# Patient Record
Sex: Female | Born: 1985 | Race: Black or African American | Hispanic: No | Marital: Single | Smoking: Current every day smoker
Health system: Southern US, Community
[De-identification: ages and names within clinical notes are randomized; demographics above are authoritative.]

## PROBLEM LIST (undated history)

## (undated) DIAGNOSIS — D649 Anemia, unspecified: Secondary | ICD-10-CM

## (undated) HISTORY — DX: Morbid (severe) obesity due to excess calories: E66.01

---

## 2017-01-13 ENCOUNTER — Other Ambulatory Visit: Payer: Self-pay

## 2017-01-13 ENCOUNTER — Encounter (HOSPITAL_COMMUNITY): Payer: Self-pay | Admitting: *Deleted

## 2017-01-13 ENCOUNTER — Emergency Department (HOSPITAL_COMMUNITY): Payer: Medicaid Other

## 2017-01-13 ENCOUNTER — Emergency Department (HOSPITAL_COMMUNITY)
Admission: EM | Admit: 2017-01-13 | Discharge: 2017-01-13 | Disposition: A | Discharge status: Home / Self Care | Payer: Medicaid Other | Attending: Emergency Medicine | Admitting: Emergency Medicine

## 2017-01-13 DIAGNOSIS — F17210 Nicotine dependence, cigarettes, uncomplicated: Secondary | ICD-10-CM | POA: Insufficient documentation

## 2017-01-13 DIAGNOSIS — M7918 Myalgia, other site: Secondary | ICD-10-CM | POA: Insufficient documentation

## 2017-01-13 DIAGNOSIS — R0789 Other chest pain: Secondary | ICD-10-CM | POA: Diagnosis not present

## 2017-01-13 DIAGNOSIS — R079 Chest pain, unspecified: Secondary | ICD-10-CM | POA: Diagnosis present

## 2017-01-13 LAB — CBC WITH DIFFERENTIAL/PLATELET
BASOS ABS: 0 10*3/uL (ref 0.0–0.1)
Basophils Relative: 0 %
EOS PCT: 2 %
Eosinophils Absolute: 0.1 10*3/uL (ref 0.0–0.7)
HCT: 36.1 % (ref 36.0–46.0)
Hemoglobin: 12.2 g/dL (ref 12.0–15.0)
LYMPHS PCT: 36 %
Lymphs Abs: 1.4 10*3/uL (ref 0.7–4.0)
MCH: 30.2 pg (ref 26.0–34.0)
MCHC: 33.8 g/dL (ref 30.0–36.0)
MCV: 89.4 fL (ref 78.0–100.0)
Monocytes Absolute: 0.3 10*3/uL (ref 0.1–1.0)
Monocytes Relative: 9 %
NEUTROS ABS: 2.1 10*3/uL (ref 1.7–7.7)
Neutrophils Relative %: 53 %
PLATELETS: 225 10*3/uL (ref 150–400)
RBC: 4.04 MIL/uL (ref 3.87–5.11)
RDW: 13.6 % (ref 11.5–15.5)
WBC: 4 10*3/uL (ref 4.0–10.5)

## 2017-01-13 LAB — COMPREHENSIVE METABOLIC PANEL
ALBUMIN: 3.8 g/dL (ref 3.5–5.0)
ALT: 13 U/L — AB (ref 14–54)
AST: 24 U/L (ref 15–41)
Alkaline Phosphatase: 54 U/L (ref 38–126)
Anion gap: 8 (ref 5–15)
BUN: 13 mg/dL (ref 6–20)
CHLORIDE: 109 mmol/L (ref 101–111)
CO2: 23 mmol/L (ref 22–32)
CREATININE: 0.93 mg/dL (ref 0.44–1.00)
Calcium: 9 mg/dL (ref 8.9–10.3)
GFR calc Af Amer: >60 mL/min (ref >60)
GLUCOSE: 104 mg/dL — AB (ref 65–99)
Potassium: 3.6 mmol/L (ref 3.5–5.1)
Sodium: 140 mmol/L (ref 135–145)
Total Bilirubin: 0.5 mg/dL (ref 0.3–1.2)
Total Protein: 6.7 g/dL (ref 6.5–8.1)

## 2017-01-13 LAB — I-STAT BETA HCG BLOOD, ED (MC, WL, AP ONLY): I-stat hCG, quantitative: <5 m[IU]/mL (ref <5)

## 2017-01-13 LAB — TROPONIN I: Troponin I: <0.03 ng/mL (ref <0.03)

## 2017-01-13 NOTE — ED Provider Notes (Signed)
[facility]   CSN: [id number] Arrival date & time: 01/13/17  0444     History   Chief Complaint Chief Complaint  Patient presents with  . Chest Pain    HPI Amanda Heath is a 31 y.o. female.  Patient with history of smoking presents with mid chest pain starting yesterday at noon.  Pain did not radiate.  It is described as sharp and worse with deep breathing.  It also hurts when she pulls herself up or moves her upper body.  She did not have any associated diaphoresis, nausea or vomiting.  Patient is very active and has not had symptoms prior to this.  It does not seem to be related to food.  Patient denies heavy NSAID or alcohol use.  Patient had a "charley horse" in her left upper arm this morning.  No significant family history of coronary artery disease in first-degree relatives.  Patient denies risk factors for pulmonary embolism including: unilateral leg swelling, history of DVT/PE/other blood clots, use of exogenous hormones, recent immobilizations, recent surgery, recent travel (>4hr segment), malignancy, hemoptysis. No treatments prior to arrival.  No fevers or cough.  No abdominal pain or diarrhea.      History reviewed. No pertinent past medical history.  There are no active problems to display for this patient.   Past Surgical History:  Procedure Laterality Date  . CESAREAN SECTION      OB History    No data available       Home Medications    Prior to Admission medications   Not on File    Family History No family history on file.  Social History Social History   Tobacco Use  . Smoking status: Current Every Day Smoker    Packs/day: 0.50    Types: Cigarettes  . Smokeless tobacco: Never Used  Substance Use Topics  . Alcohol use: Yes    Comment: ocassionally  . Drug use: Yes    Types: Marijuana     Allergies   Percocet [oxycodone-acetaminophen]   Review of Systems Review of Systems    Constitutional: Negative for diaphoresis and fever.  Eyes: Negative for redness.  Respiratory: Negative for cough and shortness of breath.   Cardiovascular: Positive for chest pain. Negative for palpitations and leg swelling.  Gastrointestinal: Negative for abdominal pain, nausea and vomiting.  Genitourinary: Negative for dysuria.  Musculoskeletal: Positive for myalgias. Negative for back pain and neck pain.  Skin: Negative for rash.  Neurological: Negative for syncope and light-headedness.  Psychiatric/Behavioral: The patient is not nervous/anxious.      Physical Exam Updated Vital Signs BP (!) 142/81 (BP Location: Right Arm)   Pulse 60   Temp 97.9 F (36.6 C) (Oral)   Resp 12   Ht 5\' 3"  (1.6 m)   Wt 95.3 kg (210 lb)   LMP 01/05/2017   SpO2 100%   BMI 37.20 kg/m   Physical Exam  Constitutional: She appears well-developed and well-nourished.  HENT:  Head: Normocephalic and atraumatic.  Eyes: Conjunctivae are normal. Right eye exhibits no discharge. Left eye exhibits no discharge.  Neck: Normal range of motion. Neck supple.  Cardiovascular: Normal rate, regular rhythm and normal heart sounds.  No murmur heard. Patient's CP causes her to wince when she pulls herself up to allow for listening to lung sounds on back. Also reproducible pain on palpation above the left breast.   Pulmonary/Chest: Effort normal and breath sounds normal. She has no decreased breath  sounds.  Abdominal: Soft. There is no tenderness.  Neurological: She is alert.  Skin: Skin is warm and dry.  Psychiatric: She has a normal mood and affect.  Nursing note and vitals reviewed.    ED Treatments / Results  Labs (all labs ordered are listed, but only abnormal results are displayed) Labs Reviewed  COMPREHENSIVE METABOLIC PANEL - Abnormal; Notable for the following components:      Result Value   Glucose, Bld 104 (*)    ALT 13 (*)    All other components within normal limits  CBC WITH  DIFFERENTIAL/PLATELET  TROPONIN I  I-STAT BETA HCG BLOOD, ED ([facility], [facility], [facility] ONLY)   ED ECG REPORT   Date: 01/13/2017  Rate: 62  Rhythm: normal sinus rhythm  QRS Axis: normal  Intervals: normal  ST/T Wave abnormalities: normal  Conduction Disutrbances:none  Narrative Interpretation:   Old EKG Reviewed: none available  I have personally reviewed the EKG tracing and agree with the computerized printout as noted.  Radiology Dg Chest 2 View  Result Date: 01/13/2017 CLINICAL DATA:  Chest pain EXAM: CHEST  2 VIEW COMPARISON:  None. FINDINGS: The cardiomediastinal contours are normal. The lungs are clear. Pulmonary vasculature is normal. No consolidation, pleural effusion, or pneumothorax. No acute osseous abnormalities are seen. IMPRESSION: No acute pulmonary process. Electronically Signed   By: Rubye OaksMelanie  Ehinger M.D.   On: 01/13/2017 05:38    Procedures Procedures (including critical care time)  Medications Ordered in ED Medications - No data to display   Initial Impression / Assessment and Plan / ED Course  I have reviewed the triage vital signs and the nursing notes.  Pertinent labs & imaging results that were available during my care of the patient were reviewed by me and considered in my medical decision making (see chart for details).     Patient seen and examined.   Vital signs reviewed and are as follows: BP (!) 142/81 (BP Location: Right Arm)   Pulse 60   Temp 97.9 F (36.6 C) (Oral)   Resp 12   Ht 5\' 3"  (1.6 m)   Wt 95.3 kg (210 lb)   LMP 01/05/2017   SpO2 100%   BMI 37.20 kg/m   Reviewed her results at bedside.   Patient was counseled to return with severe chest pain, especially if the pain is crushing or pressure-like and spreads to the arms, back, neck, or jaw, or if they have sweating, nausea, or shortness of breath with the pain. They were encouraged to call 911 with these symptoms.   They were also told to return if their chest pain gets worse and does  not go away with rest, they have an attack of chest pain lasting longer than usual despite rest and treatment with the medications their caregiver has prescribed, if they wake from sleep with chest pain or shortness of breath, if they feel dizzy or faint, if they have chest pain not typical of their usual pain, or if they have any other emergent concerns regarding their health.  The patient verbalized understanding and agreed.    Final Clinical Impressions(s) / ED Diagnoses   Final diagnoses:  Musculoskeletal chest pain   Patient with musculoskeletal-like chest pain.  Symptoms started greater than 12 hours ago and have been persistent.  Worse with positioning and movement.  EKG without any ischemic changes.  Troponin negative x1.  Chest x-ray clear.  Patient is PERC negative. HEART score = 1.  Feel patient is safe for discharged  home with follow-up with her PCP or return with worsening as discussed above.   ED Discharge Orders    None       Renne Crigler, PA-C 01/13/17 0720    Ward, Layla Maw, DO 01/13/17 2349

## 2017-01-13 NOTE — Discharge Instructions (Signed)
Please read and follow all provided instructions.  Your diagnoses today include:  1. Musculoskeletal chest pain    Tests performed today include:  An EKG of your heart  A chest x-ray  Cardiac enzymes - a blood test for heart muscle damage  Blood counts and electrolytes  Vital signs. See below for your results today.   Medications prescribed:   Naproxen - anti-inflammatory pain medication  Do not exceed 500mg  naproxen every 12 hours, take with food  You have been prescribed an anti-inflammatory medication or NSAID. Take with food. Take smallest effective dose for the shortest duration needed for your pain. Stop taking if you experience stomach pain or vomiting.    Pepcid (famotidine) - antihistamine  You can find this medication over-the-counter.   DO NOT exceed:   20mg  Pepcid every 12 hours  Take any prescribed medications only as directed.  Follow-up instructions: Please follow-up with your primary care provider as needed for further evaluation of your symptoms.   Return instructions:  SEEK IMMEDIATE MEDICAL ATTENTION IF:  You have severe chest pain, especially if the pain is crushing or pressure-like and spreads to the arms, back, neck, or jaw, or if you have sweating, nausea (feeling sick to your stomach), or shortness of breath. THIS IS AN EMERGENCY. Don't wait to see if the pain will go away. Get medical help at once. Call 911 or 0 (operator). DO NOT drive yourself to the hospital.   Your chest pain gets worse and does not go away with rest.   You have an attack of chest pain lasting longer than usual, despite rest and treatment with the medications your caregiver has prescribed.   You wake from sleep with chest pain or shortness of breath.  You feel dizzy or faint.  You have chest pain not typical of your usual pain for which you originally saw your caregiver.   You have any other emergent concerns regarding your health.  Additional Information: Chest  pain comes from many different causes. Your caregiver has diagnosed you as having chest pain that is not specific for one problem, but does not require admission.  You are at low risk for an acute heart condition or other serious illness.   Your vital signs today were: BP (!) 142/81 (BP Location: Right Arm)    Pulse 60    Temp 97.9 F (36.6 C) (Oral)    Resp 12    Ht 5\' 3"  (1.6 m)    Wt 95.3 kg (210 lb)    LMP 01/05/2017    SpO2 100%    BMI 37.20 kg/m  If your blood pressure (BP) was elevated above 135/85 this visit, please have this repeated by your doctor within one month. --------------

## 2017-01-13 NOTE — ED Triage Notes (Signed)
Patient stated her CP started about noon yesterday.  Stated she did not feel well but thought it might go away.  This morning getting ready for work noticed when she would take a deep breath her chest hurts.

## 2017-08-24 ENCOUNTER — Ambulatory Visit: Payer: Self-pay | Admitting: General Surgery

## 2017-08-24 NOTE — H&P (Signed)
History of Present Illness Axel Filler MD; 08/24/2017 2:56 PM) The patient is a 32 year old female who presents with a complaint of posterior right thigh skin tag. Patient is a 32 year old female who is referred by Dr. Isaac Bliss for chief complaint of right posterior thigh skin tag.  Patient states visit been there for several years. She states it is getting bigger and is bothersome, is not sharp, as it does get hung up on her clothing at times. She states that the no signs of infection or drainage from the area.  Patient only had a previous C-section in the past.   Past Surgical History Maurilio Lovely; 08/24/2017 2:27 PM) Cesarean Section - 1  Oral Surgery   Diagnostic Studies History Maurilio Lovely; 08/24/2017 2:27 PM) Colonoscopy  never Mammogram  >3 years ago Pap Smear  1-5 years ago  Allergies Maurilio Lovely; 08/24/2017 2:29 PM) Percocet *ANALGESICS - OPIOID*   Medication History Maurilio Lovely; 08/24/2017 2:29 PM) No Current Medications Medications Reconciled  Social History Maurilio Lovely; 08/24/2017 2:27 PM) Caffeine use  Carbonated beverages, Coffee. Illicit drug use  Uses socially only. Tobacco use  Current every day smoker.  Family History Maurilio Lovely; 08/24/2017 2:27 PM) First Degree Relatives  No pertinent family history   Pregnancy / Birth History Maurilio Lovely; 08/24/2017 2:27 PM) Age at menarche  16 years. Gravida  1 Maternal age  87-20 Para  1  Other Problems Maurilio Lovely; 08/24/2017 2:27 PM) No pertinent past medical history     Review of Systems Axel Filler MD; 08/24/2017 2:55 PM) General Not Present- Appetite Loss, Chills, Fatigue, Fever, Night Sweats, Weight Gain and Weight Loss. Skin Not Present- Change in Wart/Mole, Dryness, Hives, Jaundice, New Lesions, Non-Healing Wounds, Rash and Ulcer. HEENT Not Present- Earache, Hearing Loss, Hoarseness, Nose Bleed, Oral Ulcers, Ringing in the Ears, Seasonal Allergies, Sinus Pain, Sore  Throat, Visual Disturbances, Wears glasses/contact lenses and Yellow Eyes. Respiratory Not Present- Bloody sputum, Chronic Cough, Difficulty Breathing, Snoring and Wheezing. Breast Not Present- Breast Mass, Breast Pain, Nipple Discharge and Skin Changes. Cardiovascular Not Present- Chest Pain, Difficulty Breathing Lying Down, Leg Cramps, Palpitations, Rapid Heart Rate, Shortness of Breath and Swelling of Extremities. Gastrointestinal Not Present- Abdominal Pain, Bloating, Bloody Stool, Change in Bowel Habits, Chronic diarrhea, Constipation, Difficulty Swallowing, Excessive gas, Gets full quickly at meals, Hemorrhoids, Indigestion, Nausea, Rectal Pain and Vomiting. Female Genitourinary Not Present- Frequency, Nocturia, Painful Urination, Pelvic Pain and Urgency. Musculoskeletal Not Present- Back Pain, Joint Pain, Joint Stiffness, Muscle Pain, Muscle Weakness and Swelling of Extremities. Neurological Not Present- Decreased Memory, Fainting, Headaches, Numbness, Seizures, Tingling, Tremor, Trouble walking and Weakness. Psychiatric Not Present- Anxiety, Bipolar, Change in Sleep Pattern, Depression, Fearful and Frequent crying. Hematology Not Present- Blood Thinners, Easy Bruising, Excessive bleeding, Gland problems, HIV and Persistent Infections. All other systems negative  Vitals Maurilio Lovely; 08/24/2017 2:30 PM) 08/24/2017 2:29 PM Weight: 206.25 lb Height: 62in Body Surface Area: 1.94 m Body Mass Index: 37.72 kg/m  Temp.: 98.56F(Oral)  Pulse: 79 (Regular)  BP: 126/72 (Sitting, Left Arm, Standard)       Physical Exam Axel Filler MD; 08/24/2017 2:56 PM) The physical exam findings are as follows: Note:Constitutional: No acute distress, conversant, appears stated age  Eyes: Anicteric sclerae, moist conjunctiva, no lid lag  Neck: No thyromegaly, trachea midline, no cervical lymphadenopathy  Lungs: Clear to auscultation biilaterally, normal respiratory  effot  Cardiovascular: regular rate & rhythm, no murmurs, no peripheal edema, pedal pulses 2+  GI: Soft, no masses or hepatosplenomegaly, non-tender  to palpation  MSK: Normal gait, no clubbing cyanosis, edema  Skin: No rashes, palpation reveals normal skin turgor, right posterior thigh skin tag, approximately 3 x 3 cm in size  Psychiatric: Appropriate judgment and insight, oriented to person, place, and time    Assessment & Plan Axel Filler(Ebony Rickel MD; 08/24/2017 2:57 PM) SKIN TAG (L91.8) Impression: 32 year old female with a posterior right thigh skin tag  1. The patient will like to proceed to the operating room for excision of skin tag 2. Discussed with her the risks and benefits of the procedure to include but not limited to: Infection, bleeding, damage to structures, possible wound dehiscence. The patient was understanding and wishes to proceed.

## 2017-09-03 ENCOUNTER — Encounter (HOSPITAL_COMMUNITY): Payer: Self-pay | Admitting: Emergency Medicine

## 2017-09-03 ENCOUNTER — Ambulatory Visit (HOSPITAL_COMMUNITY)
Admission: EM | Admit: 2017-09-03 | Discharge: 2017-09-03 | Disposition: A | Discharge status: Home / Self Care | Payer: Medicaid Other | Attending: Internal Medicine | Admitting: Internal Medicine

## 2017-09-03 ENCOUNTER — Ambulatory Visit (INDEPENDENT_AMBULATORY_CARE_PROVIDER_SITE_OTHER): Payer: Medicaid Other

## 2017-09-03 DIAGNOSIS — M79672 Pain in left foot: Secondary | ICD-10-CM

## 2017-09-03 MED ORDER — MELOXICAM 7.5 MG PO TABS: 7.5000 mg | ORAL_TABLET | Freq: Every day | ORAL | 0 refills | Status: DC

## 2017-09-03 NOTE — ED Provider Notes (Signed)
[facility]    CSN: [id number] Arrival date & time: 09/03/17  0957     History   Chief Complaint Chief Complaint  Patient presents with  . Ankle Pain    HPI Amanda Heath is a 32 y.o. female.   32 year old female with no significant past medical history comes in for 2-week history of left ankle/foot pain.  States 2 weeks ago, she stepped into a ditch, and inverted her ankle.  There is significant swelling at the time, she has been elevating with ice compress with good relief.  However, she continues to have painful weightbearing, and has pain at rest as well.  States certain movement of the ankle or foot would exacerbate the pain.  She denies any numbness, tingling.  States continued to apply ice. Has not taken anything for the symptoms.       History reviewed. No pertinent past medical history.  There are no active problems to display for this patient.   Past Surgical History:  Procedure Laterality Date  . CESAREAN SECTION      OB History   None      Home Medications    Prior to Admission medications   Medication Sig Start Date End Date Taking? Authorizing Provider  meloxicam (MOBIC) 7.5 MG tablet Take 1 tablet (7.5 mg total) by mouth daily. 09/03/17   Belinda Fisher, PA-C    Family History History reviewed. No pertinent family history.  Social History Social History   Tobacco Use  . Smoking status: Current Every Day Smoker    Packs/day: 0.50    Types: Cigarettes  . Smokeless tobacco: Never Used  Substance Use Topics  . Alcohol use: Yes    Comment: ocassionally  . Drug use: Yes    Types: Marijuana     Allergies   Percocet [oxycodone-acetaminophen]   Review of Systems Review of Systems  Reason unable to perform ROS: See HPI as above.     Physical Exam Triage Vital Signs ED Triage Vitals [09/03/17 1011]  Enc Vitals Group     BP 137/84     Pulse Rate 83     Resp 18     Temp 98.4 F (36.9 C)     Temp Source Oral     SpO2 99 %   Weight      Height      Head Circumference      Peak Flow      Pain Score      Pain Loc      Pain Edu?      Excl. in GC?    No data found.  Updated Vital Signs BP 137/84 (BP Location: Right Arm)   Pulse 83   Temp 98.4 F (36.9 C) (Oral)   Resp 18   LMP 08/16/2017 (Exact Date)   SpO2 99%   Physical Exam  Constitutional: She is oriented to person, place, and time. She appears well-developed and well-nourished. No distress.  HENT:  Head: Normocephalic and atraumatic.  Eyes: Pupils are equal, round, and reactive to light. Conjunctivae are normal.  Musculoskeletal:  No obvious swelling, erythema, increased warmth, contusion seen.  No tenderness to palpation of  bilateral malleolus.  Diffuse tenderness to palpation of proximal MTPs.  Mild tenderness to palpation of third distal MTP.  Full range of motion of ankle.  Strength normal and equal bilaterally.  Sensation intact and equal bilaterally.  Pedal pulse 2+ and equal bilaterally.  Cap refill, tested on toe pad, less than 2  seconds.  Neurological: She is alert and oriented to person, place, and time.  Skin: She is not diaphoretic.   UC Treatments / Results  Labs (all labs ordered are listed, but only abnormal results are displayed) Labs Reviewed - No data to display  EKG None  Radiology Dg Foot Complete Left  Result Date: 09/03/2017 CLINICAL DATA:  Pain EXAM: LEFT FOOT - COMPLETE 3+ VIEW COMPARISON:  None. FINDINGS: There is no evidence of fracture or dislocation. There is no evidence of arthropathy or other focal bone abnormality. Soft tissues are unremarkable. IMPRESSION: Negative. Electronically Signed   By: Charlett NoseKevin  Dover M.D.   On: 09/03/2017 10:54    Procedures Procedures (including critical care time)  Medications Ordered in UC Medications - No data to display  Initial Impression / Assessment and Plan / UC Course  I have reviewed the triage vital signs and the nursing notes.  Pertinent labs & imaging results that  were available during my care of the patient were reviewed by me and considered in my medical decision making (see chart for details).    Discussed with patient given history and exam, low suspicion for fracture at  this time.  Risks and benefits of x-ray discussed, patient would like to proceed as no improvement of pain after 2 weeks.  Will obtain x-ray for further evaluation.  X-ray negative for fracture or dislocation.  NSAIDs, ice compress, elevation, Ace wrap during activity.  Return precautions given.  Patient expresses understanding and agrees to plan.  Final Clinical Impressions(s) / UC Diagnoses   Final diagnoses:  Left foot pain    ED Prescriptions    Medication Sig Dispense Auth. Provider   meloxicam (MOBIC) 7.5 MG tablet Take 1 tablet (7.5 mg total) by mouth daily. 15 tablet Threasa AlphaYu, Jodell Weitman V, PA-C        Journee Bobrowski V, [place]PA-C 09/03/17 1747

## 2017-09-03 NOTE — ED Triage Notes (Signed)
Pt here for left ankle pain after twisting 2 weeks ago

## 2017-09-03 NOTE — Discharge Instructions (Signed)
X-ray negative for fracture or dislocation.  Start Mobic for pain and inflammation. Do not take ibuprofen (motrin/advil)/ naproxen (aleve) while on mobic.  Continue ice compress, elevation.  Ace wrap during activity.  This may take a few weeks to completely resolve, but should be feeling better each week.  Follow-up with PCP for further evaluation if symptoms not improving.

## 2017-09-20 ENCOUNTER — Encounter (HOSPITAL_COMMUNITY): Payer: Self-pay | Admitting: Emergency Medicine

## 2017-09-20 ENCOUNTER — Emergency Department (HOSPITAL_COMMUNITY): Payer: Medicaid Other

## 2017-09-20 ENCOUNTER — Other Ambulatory Visit: Payer: Self-pay

## 2017-09-20 ENCOUNTER — Emergency Department (HOSPITAL_COMMUNITY)
Admission: EM | Admit: 2017-09-20 | Discharge: 2017-09-20 | Disposition: A | Discharge status: Home / Self Care | Payer: Medicaid Other | Attending: Emergency Medicine | Admitting: Emergency Medicine

## 2017-09-20 DIAGNOSIS — Y9389 Activity, other specified: Secondary | ICD-10-CM | POA: Diagnosis not present

## 2017-09-20 DIAGNOSIS — F17210 Nicotine dependence, cigarettes, uncomplicated: Secondary | ICD-10-CM | POA: Diagnosis not present

## 2017-09-20 DIAGNOSIS — S93602A Unspecified sprain of left foot, initial encounter: Secondary | ICD-10-CM | POA: Insufficient documentation

## 2017-09-20 DIAGNOSIS — Y929 Unspecified place or not applicable: Secondary | ICD-10-CM | POA: Insufficient documentation

## 2017-09-20 DIAGNOSIS — S99922A Unspecified injury of left foot, initial encounter: Secondary | ICD-10-CM | POA: Diagnosis present

## 2017-09-20 DIAGNOSIS — Y999 Unspecified external cause status: Secondary | ICD-10-CM | POA: Insufficient documentation

## 2017-09-20 DIAGNOSIS — X501XXA Overexertion from prolonged static or awkward postures, initial encounter: Secondary | ICD-10-CM | POA: Insufficient documentation

## 2017-09-20 NOTE — Discharge Instructions (Addendum)
Wear only supportive shoes. Follow-up with podiatry, referral given however you may require referral from your primary care provider depending on her insurance coverage.

## 2017-09-20 NOTE — Progress Notes (Signed)
Orthopedic Tech Progress Note Patient Details:  Amanda GrammesSandra Heath 1985/02/18 [id number]  Ortho Devices Type of Ortho Device: Prafo boot/shoe Ortho Device/Splint Location: lle Ortho Device/Splint Interventions: Ordered, Application, Adjustment   Post Interventions Patient Tolerated: Well Instructions Provided: Care of device, Adjustment of device   Trinna PostMartinez, Fidela Cieslak J 09/20/2017, 11:17 PM

## 2017-09-20 NOTE — ED Triage Notes (Signed)
Pt c/o left foot/ankle pain after twisting it 44month ago. States she has been taking meloxicam without relief.

## 2017-09-20 NOTE — ED Provider Notes (Signed)
[facility] EMERGENCY DEPARTMENT Provider Note   CSN: [id number] Arrival date & time: 09/20/17  2214     History   Chief Complaint Chief Complaint  Patient presents with  . Foot Pain    HPI Amanda Heath is a 32 y.o. female.  32 year old female presents with complaint of left foot pain x1 month.  Patient states 1 month ago she was taking the trash out and stepped on a drain and rolled her foot.  Patient was seen at urgent care on July 19 and had an x-ray and was told that her foot was not broken, given an Ace wrap and meloxicam however she continues to have pain along her proximal first metatarsal.  No other injuries once her last x-ray.  No other complaints or concerns.     History reviewed. No pertinent past medical history.  There are no active problems to display for this patient.   Past Surgical History:  Procedure Laterality Date  . CESAREAN SECTION       OB History   None      Home Medications    Prior to Admission medications   Medication Sig Start Date End Date Taking? Authorizing Provider  meloxicam (MOBIC) 7.5 MG tablet Take 1 tablet (7.5 mg total) by mouth daily. 09/03/17   Belinda Fisher, PA-C    Family History No family history on file.  Social History Social History   Tobacco Use  . Smoking status: Current Every Day Smoker    Packs/day: 0.50    Types: Cigarettes  . Smokeless tobacco: Never Used  Substance Use Topics  . Alcohol use: Yes    Comment: ocassionally  . Drug use: Yes    Types: Marijuana     Allergies   Percocet [oxycodone-acetaminophen]   Review of Systems Review of Systems  Constitutional: Negative for fever.  Musculoskeletal: Positive for arthralgias, gait problem and myalgias. Negative for joint swelling.  Skin: Negative for color change, rash and wound.  Allergic/Immunologic: Negative for immunocompromised state.  Neurological: Negative for weakness and numbness.  Hematological: Does not bruise/bleed  easily.  All other systems reviewed and are negative.    Physical Exam Updated Vital Signs BP (!) 175/89 (BP Location: Right Arm)   Pulse 76   Temp 98.2 F (36.8 C) (Oral)   Resp 16   SpO2 100%   Physical Exam  Constitutional: She is oriented to person, place, and time. She appears well-developed and well-nourished. No distress.  HENT:  Head: Normocephalic and atraumatic.  Cardiovascular: Intact distal pulses.  Pulmonary/Chest: Effort normal.  Musculoskeletal: Normal range of motion. She exhibits tenderness. She exhibits no deformity.       Feet:  Neurological: She is alert and oriented to person, place, and time.  Skin: Skin is warm and dry. She is not diaphoretic. No erythema.  Psychiatric: She has a normal mood and affect. Her behavior is normal.  Nursing note and vitals reviewed.    ED Treatments / Results  Labs (all labs ordered are listed, but only abnormal results are displayed) Labs Reviewed - No data to display  EKG None  Radiology No results found.  Procedures Procedures (including critical care time)  Medications Ordered in ED Medications - No data to display   Initial Impression / Assessment and Plan / ED Course  I have reviewed the triage vital signs and the nursing notes.  Pertinent labs & imaging results that were available during my care of the patient were reviewed by me and  considered in my medical decision making (see chart for details).  Clinical Course as of Sep 21 2254  Mon Sep 20, 2017  2255 31yo female with left foot injury 1 month ago, on going pain. Xray from 09/03/17 and UC note reviewed, no fracture. Patient given post op shoe and referral to podiatry for follow up.   [LM]    Clinical Course User Index [LM] Jeannie FendMurphy, Laura A, PA-C    Final Clinical Impressions(s) / ED Diagnoses   Final diagnoses:  Sprain of left foot, initial encounter    ED Discharge Orders    None       Jeannie FendMurphy, Laura A, PA-C 09/20/17 2256      Amanda Heath, Amanda Finlandachel Morgan, Amanda Heath 09/21/17 1513

## 2017-09-27 NOTE — Pre-Procedure Instructions (Signed)
Amanda CatchingsSandra Y Heath  09/27/2017      [id number] - [place], [place] - [address]  [place] [id number] Phone: [phone] Fax: [phone]    Your procedure is scheduled on August 20.2019  Tuesday   [facility] Admitting at 10:45 A.M.   Call this number if you have problems the morning of surgery:  [phone]   Remember:  Do not eat or drink after midnight.  .                          Take these medicines the morning of surgery with A SIP OF WATER  None   STOP TAKING ANY ASPIRIN (UNLESS OTHERWISE INSTRUCTED BY YOUR SURGEON),ANTIINFLAMATORIES (IBUPROFEN,ALEVE,MOTRIN,ADVIL,GOODY'S POWDERS),HERBAL SUPPLEMENTS,FISH OIL,AND VITAMINS 5-7 DAYS PRIOR TO SURGERY    Do not wear jewelry, make-up or nail polish.  Do not wear lotions, powders, or perfumes, or deodorant.  Do not shave 48 hours prior to surgery.  Men may shave face and neck.  Do not bring valuables to the hospital.  [facility] is not responsible for any belongings or valuables.  Contacts, dentures or bridgework may not be worn into surgery.  Leave your suitcase in the car.  After surgery it may be brought to your room.  For patients admitted to the hospital, discharge time will be determined by your treatment team.  Patients discharged the day of surgery will not be allowed to drive home.     [place] - Preparing for Surgery  Before surgery, you can play an important role.  Because skin is not sterile, your skin needs to be as free of germs as possible.  You can reduce the number of germs on you skin by washing with CHG (chlorahexidine gluconate) soap before surgery.  CHG is an antiseptic cleaner which kills germs and bonds with the skin to continue killing germs even after washing.  Oral Hygiene is also important in reducing the risk of infection.  Remember to brush your teeth with your regular  toothpaste the morning of surgery.  Please DO NOT use if you have an allergy to CHG or antibacterial soaps.  If your skin becomes reddened/irritated stop using the CHG and inform your nurse when you arrive at Short Stay.  Do not shave (including legs and underarms) for at least 48 hours prior to the first CHG shower.  You may shave your face.  Please follow these instructions carefully:   1.  Shower with CHG Soap the night before surgery and the morning of Surgery.  2.  If you choose to wash your hair, wash your hair first as usual with your normal shampoo.  3.  After you shampoo, rinse your hair and body thoroughly to remove the shampoo. 4.  Use CHG as you would any other liquid soap.  You can apply chg directly to the skin and wash gently with a      scrungie or washcloth.           5.  Apply the CHG Soap to your body ONLY FROM THE NECK DOWN.   Do not use on open wounds or open sores. Avoid contact with your eyes, ears, mouth and genitals (private parts).  Wash genitals (private parts) with your normal soap.  6.  Wash thoroughly, paying special attention to the area where your surgery will be performed.  7.  Thoroughly rinse your body with warm water from the neck down.  8.  DO NOT shower/wash with your normal soap after using and rinsing off the CHG Soap.  9.  Pat yourself dry with a clean towel.            10.  Wear clean pajamas.            11.  Place clean sheets on your bed the night of your first shower and do not sleep with pets.  Day of Surgery  Do not apply any lotions/deoderants the morning of surgery.   Please wear clean clothes to the hospital/surgery center. Remember to brush your teeth with toothpaste.    Please read over the following fact sheets that you were given. Pain Booklet and Surgical Site Infection Prevention

## 2017-09-28 ENCOUNTER — Other Ambulatory Visit: Payer: Self-pay

## 2017-09-28 ENCOUNTER — Encounter (HOSPITAL_COMMUNITY)
Admission: RE | Admit: 2017-09-28 | Discharge: 2017-09-28 | Disposition: A | Discharge status: Home / Self Care | Payer: Medicaid Other | Source: Ambulatory Visit | Attending: General Surgery | Admitting: General Surgery

## 2017-09-28 ENCOUNTER — Other Ambulatory Visit (HOSPITAL_COMMUNITY): Payer: Self-pay | Admitting: *Deleted

## 2017-09-28 ENCOUNTER — Encounter (HOSPITAL_COMMUNITY): Payer: Self-pay

## 2017-09-28 DIAGNOSIS — L918 Other hypertrophic disorders of the skin: Secondary | ICD-10-CM | POA: Insufficient documentation

## 2017-09-28 DIAGNOSIS — Z01818 Encounter for other preprocedural examination: Secondary | ICD-10-CM | POA: Diagnosis present

## 2017-09-28 HISTORY — DX: Anemia, unspecified: D64.9

## 2017-09-28 LAB — CBC
HEMATOCRIT: 37.3 % (ref 36.0–46.0)
Hemoglobin: 12.6 g/dL (ref 12.0–15.0)
MCH: 31.1 pg (ref 26.0–34.0)
MCHC: 33.8 g/dL (ref 30.0–36.0)
MCV: 92.1 fL (ref 78.0–100.0)
PLATELETS: 303 10*3/uL (ref 150–400)
RBC: 4.05 MIL/uL (ref 3.87–5.11)
RDW: 12.2 % (ref 11.5–15.5)
WBC: 3.6 10*3/uL — AB (ref 4.0–10.5)

## 2017-09-28 NOTE — Progress Notes (Addendum)
Office called for clarification of consent order.  Verbal order given for right posterior thigh skin tag.   Denies any cardiac history,never been seen by cardiologist  Denies any cardiac testing.

## 2017-10-28 ENCOUNTER — Other Ambulatory Visit: Payer: Self-pay

## 2017-10-28 ENCOUNTER — Encounter (HOSPITAL_COMMUNITY): Payer: Self-pay | Admitting: *Deleted

## 2017-10-28 NOTE — Progress Notes (Signed)
Spoke with pt for pre-op call. Pt was seen for a PAT appt on 09/28/17 but surgery got cancelled. Pt states nothing has changed with her allergies, medications, medical and surgical history.

## 2017-10-29 ENCOUNTER — Ambulatory Visit (HOSPITAL_COMMUNITY)
Admission: RE | Admit: 2017-10-29 | Discharge: 2017-10-29 | Disposition: A | Discharge status: Home / Self Care | Payer: Medicaid Other | Source: Ambulatory Visit | Attending: General Surgery | Admitting: General Surgery

## 2017-10-29 ENCOUNTER — Ambulatory Visit (HOSPITAL_COMMUNITY): Payer: Medicaid Other | Admitting: Anesthesiology

## 2017-10-29 ENCOUNTER — Encounter (HOSPITAL_COMMUNITY)
Admission: RE | Disposition: A | Discharge status: Home / Self Care | Payer: Self-pay | Source: Ambulatory Visit | Attending: General Surgery

## 2017-10-29 DIAGNOSIS — D649 Anemia, unspecified: Secondary | ICD-10-CM | POA: Insufficient documentation

## 2017-10-29 DIAGNOSIS — Z791 Long term (current) use of non-steroidal anti-inflammatories (NSAID): Secondary | ICD-10-CM | POA: Diagnosis not present

## 2017-10-29 DIAGNOSIS — F17200 Nicotine dependence, unspecified, uncomplicated: Secondary | ICD-10-CM | POA: Insufficient documentation

## 2017-10-29 DIAGNOSIS — L918 Other hypertrophic disorders of the skin: Secondary | ICD-10-CM | POA: Diagnosis present

## 2017-10-29 HISTORY — PX: EXCISION OF SKIN TAG: SHX6270

## 2017-10-29 LAB — COMPREHENSIVE METABOLIC PANEL
ALBUMIN: 3.7 g/dL (ref 3.5–5.0)
ALK PHOS: 44 U/L (ref 38–126)
ALT: 10 U/L (ref 0–44)
AST: 16 U/L (ref 15–41)
Anion gap: 7 (ref 5–15)
BILIRUBIN TOTAL: 0.7 mg/dL (ref 0.3–1.2)
BUN: 5 mg/dL — AB (ref 6–20)
CALCIUM: 8.9 mg/dL (ref 8.9–10.3)
CO2: 25 mmol/L (ref 22–32)
CREATININE: 0.73 mg/dL (ref 0.44–1.00)
Chloride: 108 mmol/L (ref 98–111)
GFR calc Af Amer: >60 mL/min (ref >60)
GFR calc non Af Amer: >60 mL/min (ref >60)
GLUCOSE: 87 mg/dL (ref 70–99)
POTASSIUM: 3.5 mmol/L (ref 3.5–5.1)
Sodium: 140 mmol/L (ref 135–145)
TOTAL PROTEIN: 6.3 g/dL — AB (ref 6.5–8.1)

## 2017-10-29 LAB — GLUCOSE, CAPILLARY: Glucose-Capillary: 133 mg/dL — ABNORMAL HIGH (ref 70–99)

## 2017-10-29 LAB — CBC
HEMATOCRIT: 35.5 % — AB (ref 36.0–46.0)
Hemoglobin: 11.9 g/dL — ABNORMAL LOW (ref 12.0–15.0)
MCH: 31.2 pg (ref 26.0–34.0)
MCHC: 33.5 g/dL (ref 30.0–36.0)
MCV: 92.9 fL (ref 78.0–100.0)
PLATELETS: 201 10*3/uL (ref 150–400)
RBC: 3.82 MIL/uL — AB (ref 3.87–5.11)
RDW: 12.3 % (ref 11.5–15.5)
WBC: 3.2 10*3/uL — AB (ref 4.0–10.5)

## 2017-10-29 LAB — POCT PREGNANCY, URINE: Preg Test, Ur: NEGATIVE

## 2017-10-29 SURGERY — EXCISION, SKIN TAG
Anesthesia: General | Site: Leg Upper | Laterality: Right

## 2017-10-29 MED ORDER — ONDANSETRON HCL 4 MG/2ML IJ SOLN
INTRAMUSCULAR | Status: DC | PRN
  Administered 2017-10-29: 4 mg via INTRAVENOUS

## 2017-10-29 MED ORDER — PROPOFOL 10 MG/ML IV BOLUS
INTRAVENOUS | Status: AC
  Filled 2017-10-29: qty 20

## 2017-10-29 MED ORDER — PROPOFOL 500 MG/50ML IV EMUL
INTRAVENOUS | Status: DC | PRN
  Administered 2017-10-29: 50 ug/kg/min via INTRAVENOUS

## 2017-10-29 MED ORDER — LACTATED RINGERS IV SOLN
INTRAVENOUS | Status: DC
  Administered 2017-10-29: 10 mL/h via INTRAVENOUS
  Administered 2017-10-29: 13:00:00 via INTRAVENOUS

## 2017-10-29 MED ORDER — FENTANYL CITRATE (PF) 100 MCG/2ML IJ SOLN
INTRAMUSCULAR | Status: DC | PRN
  Administered 2017-10-29 (×2): 25 ug via INTRAVENOUS
  Administered 2017-10-29: 50 ug via INTRAVENOUS

## 2017-10-29 MED ORDER — CELECOXIB 200 MG PO CAPS
200.0000 mg | ORAL_CAPSULE | ORAL | Status: AC
  Administered 2017-10-29: 200 mg via ORAL
  Filled 2017-10-29: qty 1

## 2017-10-29 MED ORDER — PROPOFOL 10 MG/ML IV BOLUS
INTRAVENOUS | Status: DC | PRN
  Administered 2017-10-29: 20 mg via INTRAVENOUS

## 2017-10-29 MED ORDER — ACETAMINOPHEN 500 MG PO TABS
1000.0000 mg | ORAL_TABLET | ORAL | Status: AC
  Administered 2017-10-29: 1000 mg via ORAL
  Filled 2017-10-29: qty 2

## 2017-10-29 MED ORDER — MEPERIDINE HCL 50 MG/ML IJ SOLN: 6.2500 mg | INTRAMUSCULAR | Status: DC | PRN

## 2017-10-29 MED ORDER — BUPIVACAINE HCL (PF) 0.25 % IJ SOLN
INTRAMUSCULAR | Status: AC
  Filled 2017-10-29: qty 30

## 2017-10-29 MED ORDER — 0.9 % SODIUM CHLORIDE (POUR BTL) OPTIME
TOPICAL | Status: DC | PRN
  Administered 2017-10-29: 1000 mL

## 2017-10-29 MED ORDER — ROCURONIUM BROMIDE 50 MG/5ML IV SOSY
PREFILLED_SYRINGE | INTRAVENOUS | Status: AC
  Filled 2017-10-29: qty 5

## 2017-10-29 MED ORDER — PROMETHAZINE HCL 25 MG/ML IJ SOLN: 6.2500 mg | INTRAMUSCULAR | Status: DC | PRN

## 2017-10-29 MED ORDER — GABAPENTIN 300 MG PO CAPS
300.0000 mg | ORAL_CAPSULE | ORAL | Status: AC
  Administered 2017-10-29: 300 mg via ORAL
  Filled 2017-10-29: qty 1

## 2017-10-29 MED ORDER — LIDOCAINE-EPINEPHRINE 1 %-1:100000 IJ SOLN
INTRAMUSCULAR | Status: AC
  Filled 2017-10-29: qty 1

## 2017-10-29 MED ORDER — BUPIVACAINE-EPINEPHRINE 0.25% -1:200000 IJ SOLN
INTRAMUSCULAR | Status: DC | PRN
  Administered 2017-10-29: 10 mL

## 2017-10-29 MED ORDER — LIDOCAINE HCL (PF) 1 % IJ SOLN
INTRAMUSCULAR | Status: AC
  Filled 2017-10-29: qty 30

## 2017-10-29 MED ORDER — ONDANSETRON HCL 4 MG/2ML IJ SOLN
INTRAMUSCULAR | Status: AC
  Filled 2017-10-29: qty 2

## 2017-10-29 MED ORDER — LIDOCAINE 2% (20 MG/ML) 5 ML SYRINGE
INTRAMUSCULAR | Status: AC
  Filled 2017-10-29: qty 5

## 2017-10-29 MED ORDER — CHLORHEXIDINE GLUCONATE CLOTH 2 % EX PADS: 6.0000 | MEDICATED_PAD | Freq: Once | CUTANEOUS | Status: DC

## 2017-10-29 MED ORDER — HYDROMORPHONE HCL 1 MG/ML IJ SOLN: 0.2500 mg | INTRAMUSCULAR | Status: DC | PRN

## 2017-10-29 MED ORDER — FENTANYL CITRATE (PF) 250 MCG/5ML IJ SOLN
INTRAMUSCULAR | Status: AC
  Filled 2017-10-29: qty 5

## 2017-10-29 MED ORDER — MIDAZOLAM HCL 5 MG/5ML IJ SOLN
INTRAMUSCULAR | Status: DC | PRN
  Administered 2017-10-29: 2 mg via INTRAVENOUS

## 2017-10-29 MED ORDER — DEXAMETHASONE SODIUM PHOSPHATE 10 MG/ML IJ SOLN
INTRAMUSCULAR | Status: AC
  Filled 2017-10-29: qty 1

## 2017-10-29 MED ORDER — BUPIVACAINE-EPINEPHRINE (PF) 0.25% -1:200000 IJ SOLN
INTRAMUSCULAR | Status: AC
  Filled 2017-10-29: qty 30

## 2017-10-29 MED ORDER — MIDAZOLAM HCL 2 MG/2ML IJ SOLN
INTRAMUSCULAR | Status: AC
  Filled 2017-10-29: qty 2

## 2017-10-29 MED ORDER — LACTATED RINGERS IV SOLN
INTRAVENOUS | Status: DC
  Administered 2017-10-29: 10 mL/h via INTRAVENOUS

## 2017-10-29 MED ORDER — PHENYLEPHRINE 40 MCG/ML (10ML) SYRINGE FOR IV PUSH (FOR BLOOD PRESSURE SUPPORT)
PREFILLED_SYRINGE | INTRAVENOUS | Status: AC
  Filled 2017-10-29: qty 10

## 2017-10-29 MED ORDER — LIDOCAINE HCL (CARDIAC) PF 100 MG/5ML IV SOSY
PREFILLED_SYRINGE | INTRAVENOUS | Status: DC | PRN
  Administered 2017-10-29: 40 mg via INTRATRACHEAL

## 2017-10-29 MED ORDER — CEFAZOLIN SODIUM-DEXTROSE 2-4 GM/100ML-% IV SOLN
2.0000 g | INTRAVENOUS | Status: AC
  Administered 2017-10-29: 2 g via INTRAVENOUS
  Filled 2017-10-29: qty 100

## 2017-10-29 MED ORDER — TRAMADOL HCL 50 MG PO TABS: 50.0000 mg | ORAL_TABLET | Freq: Four times a day (QID) | ORAL | 0 refills | Status: AC | PRN

## 2017-10-29 SURGICAL SUPPLY — 43 items
BENZOIN TINCTURE PRP APPL 2/3 (GAUZE/BANDAGES/DRESSINGS) IMPLANT
BLADE CLIPPER SURG (BLADE) IMPLANT
CANISTER SUCT 3000ML PPV (MISCELLANEOUS) IMPLANT
CHLORAPREP W/TINT 10.5 ML (MISCELLANEOUS) ×3 IMPLANT
CHLORAPREP W/TINT 26ML (MISCELLANEOUS) IMPLANT
CLOSURE WOUND 1/2 X4 (GAUZE/BANDAGES/DRESSINGS)
CONNECTOR 5 IN 1 STRAIGHT STRL (MISCELLANEOUS) ×3 IMPLANT
COVER SURGICAL LIGHT HANDLE (MISCELLANEOUS) ×3 IMPLANT
DECANTER SPIKE VIAL GLASS SM (MISCELLANEOUS) ×3 IMPLANT
DRAPE LAPAROTOMY 100X72 PEDS (DRAPES) ×3 IMPLANT
DRAPE ORTHO SPLIT 77X108 STRL (DRAPES)
DRAPE SURG ORHT 6 SPLT 77X108 (DRAPES) IMPLANT
ELECT CAUTERY BLADE 6.4 (BLADE) ×3 IMPLANT
ELECT REM PT RETURN 9FT ADLT (ELECTROSURGICAL) ×3
ELECTRODE REM PT RTRN 9FT ADLT (ELECTROSURGICAL) ×1 IMPLANT
GAUZE SPONGE 4X4 12PLY STRL (GAUZE/BANDAGES/DRESSINGS) IMPLANT
GLOVE BIO SURGEON STRL SZ7.5 (GLOVE) ×3 IMPLANT
GLOVE BIOGEL PI IND STRL 8 (GLOVE) ×1 IMPLANT
GLOVE BIOGEL PI INDICATOR 8 (GLOVE) ×2
GOWN STRL REUS W/ TWL LRG LVL3 (GOWN DISPOSABLE) ×1 IMPLANT
GOWN STRL REUS W/ TWL XL LVL3 (GOWN DISPOSABLE) ×1 IMPLANT
GOWN STRL REUS W/TWL LRG LVL3 (GOWN DISPOSABLE) ×2
GOWN STRL REUS W/TWL XL LVL3 (GOWN DISPOSABLE) ×2
KIT BASIN OR (CUSTOM PROCEDURE TRAY) ×3 IMPLANT
KIT TURNOVER KIT B (KITS) ×3 IMPLANT
NEEDLE HYPO 25GX1X1/2 BEV (NEEDLE) ×3 IMPLANT
NS IRRIG 1000ML POUR BTL (IV SOLUTION) ×3 IMPLANT
PACK SURGICAL SETUP 50X90 (CUSTOM PROCEDURE TRAY) ×3 IMPLANT
PAD ARMBOARD 7.5X6 YLW CONV (MISCELLANEOUS) ×6 IMPLANT
PENCIL BUTTON HOLSTER BLD 10FT (ELECTRODE) ×3 IMPLANT
SPECIMEN JAR SMALL (MISCELLANEOUS) ×3 IMPLANT
SPONGE LAP 18X18 X RAY DECT (DISPOSABLE) ×3 IMPLANT
STRIP CLOSURE SKIN 1/2X4 (GAUZE/BANDAGES/DRESSINGS) IMPLANT
SUT MNCRL AB 4-0 PS2 18 (SUTURE) ×3 IMPLANT
SUT VIC AB 3-0 SH 18 (SUTURE) ×3 IMPLANT
SYR BULB 3OZ (MISCELLANEOUS) ×3 IMPLANT
SYR CONTROL 10ML LL (SYRINGE) ×3 IMPLANT
TOWEL OR 17X24 6PK STRL BLUE (TOWEL DISPOSABLE) ×3 IMPLANT
TOWEL OR 17X26 10 PK STRL BLUE (TOWEL DISPOSABLE) ×3 IMPLANT
TUBE CONNECTING 12'X1/4 (SUCTIONS) ×1
TUBE CONNECTING 12X1/4 (SUCTIONS) ×2 IMPLANT
UNDERPAD 30X30 (UNDERPADS AND DIAPERS) IMPLANT
YANKAUER SUCT BULB TIP NO VENT (SUCTIONS) IMPLANT

## 2017-10-29 NOTE — Op Note (Signed)
10/29/2017  1:44 PM  PATIENT:  Amanda Heath  32 y.o. female  PRE-OPERATIVE DIAGNOSIS:  SKIN TAG RIGHT POSTERIOR THIGH  POST-OPERATIVE DIAGNOSIS:  SKIN TAG RIGHT POSTERIOR THIGH  PROCEDURE:  Procedure(s): EXCISION OF RIGHT POSTERIOR THIGH SKIN TAG 2.5x3.5cm (Right)  SURGEON:  Surgeon(s) and Role:    Ralene Ok, MD - Primary  ANESTHESIA:   local and general  EBL:  minimal   BLOOD ADMINISTERED:none  DRAINS: none   LOCAL MEDICATIONS USED:  BUPIVICAINE   SPECIMEN:  Source of Specimen:  RIGHT POSTERIOR THIGH SKIN TAG 2.5x3.5cm   DISPOSITION OF SPECIMEN:  PATHOLOGY  COUNTS:  YES  TOURNIQUET:  * No tourniquets in log *  DICTATION: .Dragon Dictation After the patient was consented she was taken to OR and placed in supine position with bilateral SCDs in place. She underwent MAC anesthesia. She was in place in the lateral position. Patient was prepped and draped in standard fashion. Timeout was called all facts verified.  Quarter percent Marcaine with epinephrine was then used to create a field block around the cyst.  A 1.5cm elliptical incision was made over the area of the mass. This was dissected down sharply and circumferentially down to healthy fat tissue. This was excised. This was sent to pathology. Cautery was used to maintain hemostasis. At this time 3-0 Monocryl was used to reapproximate the skin in a subcuticular fashion. The skin was dressed with Dermabond. The patient tied the procedure well was taken to recovery room stable condition.   PLAN OF CARE: Discharge to home after PACU  PATIENT DISPOSITION:  PACU - hemodynamically stable.   Delay start of Pharmacological VTE agent (>24hrs) due to surgical blood loss or risk of bleeding: not applicable

## 2017-10-29 NOTE — Anesthesia Preprocedure Evaluation (Addendum)
Anesthesia Evaluation  Patient identified by MRN, date of birth, ID band Patient awake    Reviewed: Allergy & Precautions, NPO status , Patient's Chart, lab work & pertinent test results  Airway Mallampati: II  TM Distance: >3 FB Neck ROM: Full    Dental no notable dental hx. (+) Dental Advisory Given, Teeth Intact   Pulmonary neg pulmonary ROS, Current Smoker,    Pulmonary exam normal breath sounds clear to auscultation       Cardiovascular negative cardio ROS Normal cardiovascular exam Rhythm:Regular Rate:Normal     Neuro/Psych negative neurological ROS  negative psych ROS   GI/Hepatic negative GI ROS, Neg liver ROS,   Endo/Other  negative endocrine ROS  Renal/GU negative Renal ROS  negative genitourinary   Musculoskeletal negative musculoskeletal ROS (+)   Abdominal (+) + obese,   Peds negative pediatric ROS (+)  Hematology negative hematology ROS (+) anemia ,   Anesthesia Other Findings   Reproductive/Obstetrics negative OB ROS                            Anesthesia Physical Anesthesia Plan  ASA: II  Anesthesia Plan: General   Post-op Pain Management:    Induction: Intravenous  PONV Risk Score and Plan: 2 and Ondansetron and Midazolam  Airway Management Planned: Oral ETT  Additional Equipment:   Intra-op Plan:   Post-operative Plan: Extubation in OR  Informed Consent: I have reviewed the patients History and Physical, chart, labs and discussed the procedure including the risks, benefits and alternatives for the proposed anesthesia with the patient or authorized representative who has indicated his/her understanding and acceptance.   Dental advisory given  Plan Discussed with: CRNA  Anesthesia Plan Comments:         Anesthesia Quick Evaluation

## 2017-10-29 NOTE — Anesthesia Postprocedure Evaluation (Signed)
Anesthesia Post Note  Patient: LANAYA BENNIS  Procedure(s) Performed: EXCISION OF RIGHT POSTERIOR THIGH SKIN TAG (Right Leg Upper)     Patient location during evaluation: PACU Anesthesia Type: MAC Level of consciousness: awake and alert Pain management: pain level controlled Vital Signs Assessment: post-procedure vital signs reviewed and stable Respiratory status: spontaneous breathing, nonlabored ventilation and respiratory function stable Cardiovascular status: stable and blood pressure returned to baseline Postop Assessment: no apparent nausea or vomiting Anesthetic complications: no    Last Vitals:  Vitals:   10/29/17 1410 10/29/17 1415  BP: (!) 154/89   Pulse: (!) 47   Resp: 16   Temp: 36.5 C 36.5 C  SpO2: 99%     Last Pain:  Vitals:   10/29/17 1035  TempSrc: Oral                 Lynda Rainwater

## 2017-10-29 NOTE — H&P (Signed)
History of Present Illness  The patient is a 32 year old female who presents with a complaint of posterior right thigh skin tag. Patient is a 32 year old female who is referred by Dr. Isaac Bliss for chief complaint of right posterior thigh skin tag.  Patient states visit been there for several years. She states it is getting bigger and is bothersome, is not sharp, as it does get hung up on her clothing at times. She states that the no signs of infection or drainage from the area.  Patient only had a previous C-section in the past.   Past Surgical History Cesarean Section - 1  Oral Surgery   Diagnostic Studies History Colonoscopy  never Mammogram  >3 years ago Pap Smear  1-5 years ago  Allergies Percocet *ANALGESICS - OPIOID*   Medication History No Current Medications Medications Reconciled  Social History  Caffeine use  Carbonated beverages, Coffee. Illicit drug use  Uses socially only. Tobacco use  Current every day smoker.  Family History  First Degree Relatives  No pertinent family history   Pregnancy / Birth History  Age at menarche  16 years. Gravida  1 Maternal age  49-20 Para  1  Other Problems  No pertinent past medical history     Review of Systems  General Not Present- Appetite Loss, Chills, Fatigue, Fever, Night Sweats, Weight Gain and Weight Loss. Skin Not Present- Change in Wart/Mole, Dryness, Hives, Jaundice, New Lesions, Non-Healing Wounds, Rash and Ulcer. HEENT Not Present- Earache, Hearing Loss, Hoarseness, Nose Bleed, Oral Ulcers, Ringing in the Ears, Seasonal Allergies, Sinus Pain, Sore Throat, Visual Disturbances, Wears glasses/contact lenses and Yellow Eyes. Respiratory Not Present- Bloody sputum, Chronic Cough, Difficulty Breathing, Snoring and Wheezing. Breast Not Present- Breast Mass, Breast Pain, Nipple Discharge and Skin Changes. Cardiovascular Not Present- Chest Pain, Difficulty Breathing Lying Down, Leg  Cramps, Palpitations, Rapid Heart Rate, Shortness of Breath and Swelling of Extremities. Gastrointestinal Not Present- Abdominal Pain, Bloating, Bloody Stool, Change in Bowel Habits, Chronic diarrhea, Constipation, Difficulty Swallowing, Excessive gas, Gets full quickly at meals, Hemorrhoids, Indigestion, Nausea, Rectal Pain and Vomiting. Female Genitourinary Not Present- Frequency, Nocturia, Painful Urination, Pelvic Pain and Urgency. Musculoskeletal Not Present- Back Pain, Joint Pain, Joint Stiffness, Muscle Pain, Muscle Weakness and Swelling of Extremities. Neurological Not Present- Decreased Memory, Fainting, Headaches, Numbness, Seizures, Tingling, Tremor, Trouble walking and Weakness. Psychiatric Not Present- Anxiety, Bipolar, Change in Sleep Pattern, Depression, Fearful and Frequent crying. Hematology Not Present- Blood Thinners, Easy Bruising, Excessive bleeding, Gland problems, HIV and Persistent Infections. All other systems negative  BP (!) 159/87   Pulse 62   Temp 98 F (36.7 C) (Oral)   Resp 20   Ht 5\' 3"  (1.6 m)   Wt 94.3 kg   SpO2 100%   BMI 36.85 kg/m      Physical Exam  The physical exam findings are as follows: Note:Constitutional: No acute distress, conversant, appears stated age  Eyes: Anicteric sclerae, moist conjunctiva, no lid lag  Neck: No thyromegaly, trachea midline, no cervical lymphadenopathy  Lungs: Clear to auscultation biilaterally, normal respiratory effot  Cardiovascular: regular rate & rhythm, no murmurs, no peripheal edema, pedal pulses 2+  GI: Soft, no masses or hepatosplenomegaly, non-tender to palpation  MSK: Normal gait, no clubbing cyanosis, edema  Skin: No rashes, palpation reveals normal skin turgor, right posterior thigh skin tag, approximately 3 x 3 cm in size  Psychiatric: Appropriate judgment and insight, oriented to person, place, and time    Assessment & Plan SKIN TAG (  L91.8) Impression: 32 year old female  with a posterior right thigh skin tag  1. The patient will like to proceed to the operating room for excision of skin tag 2. Discussed with her the risks and benefits of the procedure to include but not limited to: Infection, bleeding, damage to structures, possible wound dehiscence. The patient was understanding and wishes to proceed.

## 2017-10-29 NOTE — Transfer of Care (Signed)
Immediate Anesthesia Transfer of Care Note  Patient: Amanda Heath  Procedure(s) Performed: EXCISION OF RIGHT POSTERIOR THIGH SKIN TAG (Right Leg Upper)  Patient Location: PACU  Anesthesia Type:MAC  Level of Consciousness: awake, alert  and oriented  Airway & Oxygen Therapy: Patient Spontanous Breathing  Post-op Assessment: Post -op Vital signs reviewed and stable  Post vital signs: stable  Last Vitals:  Vitals Value Taken Time  BP 135/94 10/29/2017  1:54 PM  Temp    Pulse 53 10/29/2017  1:56 PM  Resp 12 10/29/2017  1:56 PM  SpO2 100 % 10/29/2017  1:56 PM  Vitals shown include unvalidated device data.  Last Pain:  Vitals:   10/29/17 1035  TempSrc: Oral         Complications: No apparent anesthesia complications

## 2017-10-30 ENCOUNTER — Encounter (HOSPITAL_COMMUNITY): Payer: Self-pay | Admitting: General Surgery

## 2018-03-06 ENCOUNTER — Other Ambulatory Visit: Payer: Self-pay

## 2018-03-06 ENCOUNTER — Ambulatory Visit (HOSPITAL_COMMUNITY)
Admission: EM | Admit: 2018-03-06 | Discharge: 2018-03-06 | Disposition: A | Discharge status: Home / Self Care | Payer: Medicaid Other | Attending: Internal Medicine | Admitting: Internal Medicine

## 2018-03-06 ENCOUNTER — Encounter (HOSPITAL_COMMUNITY): Payer: Self-pay | Admitting: *Deleted

## 2018-03-06 DIAGNOSIS — J0120 Acute ethmoidal sinusitis, unspecified: Secondary | ICD-10-CM | POA: Diagnosis not present

## 2018-03-06 DIAGNOSIS — R05 Cough: Secondary | ICD-10-CM | POA: Diagnosis not present

## 2018-03-06 DIAGNOSIS — R059 Cough, unspecified: Secondary | ICD-10-CM

## 2018-03-06 MED ORDER — AMOXICILLIN-POT CLAVULANATE 875-125 MG PO TABS: 1.0000 | ORAL_TABLET | Freq: Two times a day (BID) | ORAL | 0 refills | Status: DC

## 2018-03-06 MED ORDER — BENZONATATE 100 MG PO CAPS: 100.0000 mg | ORAL_CAPSULE | Freq: Three times a day (TID) | ORAL | 0 refills | Status: DC

## 2018-03-06 MED ORDER — PREDNISONE 10 MG PO TABS: 20.0000 mg | ORAL_TABLET | Freq: Every day | ORAL | 0 refills | Status: AC

## 2018-03-06 NOTE — ED Provider Notes (Signed)
[facility]    CSN: [id number] Arrival date & time: 03/06/18  1003     History   Chief Complaint Chief Complaint  Patient presents with  . Cough    HPI Amanda Heath is a 33 y.o. female.   33 year old female with no chronic medical problems presents to urgent care complaining of body aches and cough.  The patient states that her symptoms began 3 weeks ago with runny nose and mild congestion.  They have progressed to nonproductive cough that keeps her up at night as well as intermittent fevers/chills and sinus pressure.  She denies nausea, vomiting or diarrhea.  She also denies shortness of breath.     Past Medical History:  Diagnosis Date  . Anemia    blood transfusion after childbirth    There are no active problems to display for this patient.   Past Surgical History:  Procedure Laterality Date  . CESAREAN SECTION    . EXCISION OF SKIN TAG Right 10/29/2017   Procedure: EXCISION OF RIGHT POSTERIOR THIGH SKIN TAG;  Surgeon: Axel Filler, MD;  Location: [facility] OR;  Service: General;  Laterality: Right;    OB History   No obstetric history on file.      Home Medications    Prior to Admission medications   Medication Sig Start Date End Date Taking? Authorizing Provider  amoxicillin-clavulanate (AUGMENTIN) 875-125 MG tablet Take 1 tablet by mouth every 12 (twelve) hours. 03/06/18   Arnaldo Natal, MD  benzonatate (TESSALON) 100 MG capsule Take 1 capsule (100 mg total) by mouth every 8 (eight) hours. 03/06/18   Arnaldo Natal, MD  ibuprofen (ADVIL,MOTRIN) 200 MG tablet Take 200-600 mg by mouth daily as needed for headache or moderate pain.    [provider]  meloxicam (MOBIC) 7.5 MG tablet Take 1 tablet (7.5 mg total) by mouth daily. Patient not taking: Reported on 09/21/2017 09/03/17   Belinda Fisher, PA-C  predniSONE (DELTASONE) 10 MG tablet Take 2 tablets (20 mg total) by mouth daily for 4 days. 03/06/18 03/10/18  Arnaldo Natal, MD  traMADol  (ULTRAM) 50 MG tablet Take 1 tablet (50 mg total) by mouth every 6 (six) hours as needed. 10/29/17 10/29/18  Axel Filler, MD    Family History Family History  Problem Relation Age of Onset  . Healthy Mother   . Healthy Father     Social History Social History   Tobacco Use  . Smoking status: Current Every Day Smoker    Packs/day: 0.25    Types: Cigarettes  . Smokeless tobacco: Never Used  Substance Use Topics  . Alcohol use: Yes    Comment: occasional  . Drug use: Yes    Types: Marijuana    Comment: daily     Allergies   Percocet [oxycodone-acetaminophen]   Review of Systems Review of Systems  Constitutional: Positive for chills. Negative for fever.  HENT: Positive for sinus pressure. Negative for sore throat and tinnitus.   Eyes: Negative for redness.  Respiratory: Positive for cough. Negative for shortness of breath.   Cardiovascular: Negative for chest pain and palpitations.  Gastrointestinal: Negative for abdominal pain, diarrhea, nausea and vomiting.  Genitourinary: Negative for dysuria, frequency and urgency.  Musculoskeletal: Negative for myalgias.  Skin: Negative for rash.       No lesions  Neurological: Negative for weakness.  Hematological: Does not bruise/bleed easily.  Psychiatric/Behavioral: Negative for suicidal ideas.     Physical Exam Triage Vital Signs ED Triage Vitals [  03/06/18 1025]  Enc Vitals Group     BP (!) 140/96     Pulse Rate 73     Resp 16     Temp 97.7 F (36.5 C)     Temp Source Oral     SpO2 99 %     Weight      Height      Head Circumference      Peak Flow      Pain Score 7     Pain Loc      Pain Edu?      Excl. in GC?    No data found.  Updated Vital Signs BP (!) 140/96   Pulse 73   Temp 97.7 F (36.5 C) (Oral)   Resp 16   LMP 02/28/2018 (Approximate)   SpO2 99%   Visual Acuity Right Eye Distance:   Left Eye Distance:   Bilateral Distance:    Right Eye Near:   Left Eye Near:    Bilateral Near:       Physical Exam Vitals signs and nursing note reviewed.  Constitutional:      General: She is not in acute distress.    Appearance: She is well-developed.  HENT:     Head: Normocephalic and atraumatic.     Nose:     Right Sinus: Maxillary sinus tenderness present.     Left Sinus: Maxillary sinus tenderness present.  Eyes:     General: No scleral icterus.    Conjunctiva/sclera: Conjunctivae normal.     Pupils: Pupils are equal, round, and reactive to light.  Neck:     Musculoskeletal: Normal range of motion and neck supple.     Thyroid: No thyromegaly.     Vascular: No JVD.     Trachea: No tracheal deviation.  Cardiovascular:     Rate and Rhythm: Normal rate and regular rhythm.     Heart sounds: Normal heart sounds. No murmur. No friction rub. No gallop.   Pulmonary:     Effort: Pulmonary effort is normal.     Breath sounds: Normal breath sounds.  Abdominal:     General: Bowel sounds are normal. There is no distension.     Palpations: Abdomen is soft.     Tenderness: There is no abdominal tenderness.  Musculoskeletal: Normal range of motion.  Lymphadenopathy:     Cervical: No cervical adenopathy.  Skin:    General: Skin is warm and dry.  Neurological:     Mental Status: She is alert and oriented to person, place, and time.     Cranial Nerves: No cranial nerve deficit.  Psychiatric:        Behavior: Behavior normal.        Thought Content: Thought content normal.        Judgment: Judgment normal.      UC Treatments / Results  Labs (all labs ordered are listed, but only abnormal results are displayed) Labs Reviewed - No data to display  EKG None  Radiology No results found.  Procedures Procedures (including critical care time)  Medications Ordered in UC Medications - No data to display  Initial Impression / Assessment and Plan / UC Course  I have reviewed the triage vital signs and the nursing notes.  Pertinent labs & imaging results that were available  during my care of the patient were reviewed by me and considered in my medical decision making (see chart for details).     Symptoms consistent with sinusitis with postnasal drip  cough.  Final Clinical Impressions(s) / UC Diagnoses   Final diagnoses:  Subacute ethmoidal sinusitis  Cough   Discharge Instructions   None    ED Prescriptions    Medication Sig Dispense Auth. Provider   amoxicillin-clavulanate (AUGMENTIN) 875-125 MG tablet Take 1 tablet by mouth every 12 (twelve) hours. 14 tablet Page Lancon S, MD   predniSONE Arnaldo Natal(DELTASONE) 10 MG tablet Take 2 tablets (20 mg total) by mouth daily for 4 days. 8 tablet Arnaldo Nataliamond, Abri Vacca S, MD   benzonatate (TESSALON) 100 MG capsule Take 1 capsule (100 mg total) by mouth every 8 (eight) hours. 21 capsule Arnaldo Nataliamond, Stephenia Vogan S, MD     Controlled Substance Prescriptions [place] Controlled Substance Registry consulted? Not Applicable   Arnaldo Nataliamond, Keysha Damewood S, MD 03/06/18 2009

## 2018-03-06 NOTE — ED Triage Notes (Signed)
C/O cough, congestion x 3 wks.  Now having body aches.  States getting worse.  Unsure if fevers.

## 2019-02-28 ENCOUNTER — Ambulatory Visit: Payer: Medicaid Other | Attending: Internal Medicine

## 2019-02-28 DIAGNOSIS — Z20822 Contact with and (suspected) exposure to covid-19: Secondary | ICD-10-CM

## 2019-03-02 ENCOUNTER — Ambulatory Visit: Payer: Self-pay | Admitting: *Deleted

## 2019-03-02 LAB — NOVEL CORONAVIRUS, NAA: SARS-CoV-2, NAA: NOT DETECTED

## 2019-03-02 NOTE — Telephone Encounter (Signed)
Provided information on isolation and ending isolation.  Reason for Disposition . General information question, no triage required and triager able to answer question  Answer Assessment - Initial Assessment Questions 1. REASON FOR CALL or QUESTION: "What is your reason for calling today?" or "How can I best help you?" or "What question do you have that I can help answer?"    Questions regarding positive and negative test results in the same household.  Protocols used: INFORMATION ONLY CALL-A-AH

## 2019-12-01 ENCOUNTER — Other Ambulatory Visit: Payer: Medicaid Other

## 2019-12-01 DIAGNOSIS — Z20822 Contact with and (suspected) exposure to covid-19: Secondary | ICD-10-CM

## 2019-12-03 LAB — SARS-COV-2, NAA 2 DAY TAT

## 2019-12-03 LAB — NOVEL CORONAVIRUS, NAA: SARS-CoV-2, NAA: NOT DETECTED

## 2019-12-04 ENCOUNTER — Telehealth: Payer: Self-pay

## 2019-12-04 NOTE — Telephone Encounter (Signed)
Pt aware covid lab test negative, covid not detected 

## 2019-12-09 ENCOUNTER — Other Ambulatory Visit: Payer: Medicaid Other

## 2019-12-09 DIAGNOSIS — Z20822 Contact with and (suspected) exposure to covid-19: Secondary | ICD-10-CM

## 2019-12-10 LAB — SARS-COV-2, NAA 2 DAY TAT

## 2019-12-10 LAB — NOVEL CORONAVIRUS, NAA: SARS-CoV-2, NAA: NOT DETECTED

## 2019-12-14 ENCOUNTER — Other Ambulatory Visit: Payer: Medicaid Other

## 2019-12-14 DIAGNOSIS — Z20822 Contact with and (suspected) exposure to covid-19: Secondary | ICD-10-CM

## 2019-12-15 LAB — NOVEL CORONAVIRUS, NAA: SARS-CoV-2, NAA: DETECTED — AB

## 2019-12-15 LAB — SARS-COV-2, NAA 2 DAY TAT

## 2019-12-16 ENCOUNTER — Encounter: Payer: Self-pay | Admitting: Physician Assistant

## 2019-12-16 ENCOUNTER — Ambulatory Visit (HOSPITAL_COMMUNITY)
Admission: RE | Admit: 2019-12-16 | Discharge: 2019-12-16 | Disposition: A | Discharge status: Home / Self Care | Payer: Medicaid Other | Source: Ambulatory Visit | Attending: Pulmonary Disease | Admitting: Pulmonary Disease

## 2019-12-16 ENCOUNTER — Other Ambulatory Visit: Payer: Self-pay | Admitting: Physician Assistant

## 2019-12-16 DIAGNOSIS — U071 COVID-19: Secondary | ICD-10-CM

## 2019-12-16 MED ORDER — SODIUM CHLORIDE 0.9 % IV SOLN: INTRAVENOUS | Status: DC | PRN

## 2019-12-16 MED ORDER — DIPHENHYDRAMINE HCL 50 MG/ML IJ SOLN: 50.0000 mg | Freq: Once | INTRAMUSCULAR | Status: DC | PRN

## 2019-12-16 MED ORDER — EPINEPHRINE 0.3 MG/0.3ML IJ SOAJ: 0.3000 mg | Freq: Once | INTRAMUSCULAR | Status: DC | PRN

## 2019-12-16 MED ORDER — METHYLPREDNISOLONE SODIUM SUCC 125 MG IJ SOLR: 125.0000 mg | Freq: Once | INTRAMUSCULAR | Status: DC | PRN

## 2019-12-16 MED ORDER — FAMOTIDINE IN NACL 20-0.9 MG/50ML-% IV SOLN: 20.0000 mg | Freq: Once | INTRAVENOUS | Status: DC | PRN

## 2019-12-16 MED ORDER — SODIUM CHLORIDE 0.9 % IV SOLN: Freq: Once | INTRAVENOUS | Status: AC

## 2019-12-16 MED ORDER — ALBUTEROL SULFATE HFA 108 (90 BASE) MCG/ACT IN AERS: 2.0000 | INHALATION_SPRAY | Freq: Once | RESPIRATORY_TRACT | Status: DC | PRN

## 2019-12-16 NOTE — Discharge Instructions (Signed)

## 2019-12-16 NOTE — Progress Notes (Signed)
I connected by phone with Tasia Catchings on 12/16/2019 at 8:54 AM to discuss the potential use of a new treatment for mild to moderate COVID-19 viral infection in non-hospitalized patients.  This patient is a 34 y.o. female that meets the FDA criteria for Emergency Use Authorization of COVID monoclonal antibody casirivimab/imdevimab or bamlamivimab/estevimab.  Has a (+) direct SARS-CoV-2 viral test result  Has mild or moderate COVID-19   Is NOT hospitalized due to COVID-19  Is within 10 days of symptom onset  Has at least one of the high risk factor(s) for progression to severe COVID-19 and/or hospitalization as defined in EUA.  Specific high risk criteria : BMI > 25 and Other high risk medical condition per CDC:  elevated social vulnerability index   I have spoken and communicated the following to the patient or parent/caregiver regarding COVID monoclonal antibody treatment:  1. FDA has authorized the emergency use for the treatment of mild to moderate COVID-19 in adults and pediatric patients with positive results of direct SARS-CoV-2 viral testing who are 81 years of age and older weighing at least 40 kg, and who are at high risk for progressing to severe COVID-19 and/or hospitalization.  2. The significant known and potential risks and benefits of COVID monoclonal antibody, and the extent to which such potential risks and benefits are unknown.  3. Information on available alternative treatments and the risks and benefits of those alternatives, including clinical trials.  4. Patients treated with COVID monoclonal antibody should continue to self-isolate and use infection control measures (e.g., wear mask, isolate, social distance, avoid sharing personal items, clean and disinfect "high touch" surfaces, and frequent handwashing) according to CDC guidelines.   5. The patient or parent/caregiver has the option to accept or refuse COVID monoclonal antibody treatment.  After reviewing this  information with the patient, the patient has agreed to receive one of the available covid 19 monoclonal antibodies and will be provided an appropriate fact sheet prior to infusion.  Sx onset 10/26. Set up for infusion on 10/30 @ 2:30pm. Directions given to [facility]. Pt is aware that insurance will be charged an infusion fee. Pt is unvaccinated.   Cline Crock 12/16/2019 8:54 AM

## 2019-12-16 NOTE — Progress Notes (Signed)
  Diagnosis: COVID-19  Physician: Patrick Wright, MD  Procedure: Covid Infusion Clinic Med: bamlanivimab\etesevimab infusion - Provided patient with bamlanimivab\etesevimab fact sheet for patients, parents and caregivers prior to infusion.  Complications: No immediate complications noted.  Discharge: Discharged home   Amanda Heath N [name] 12/16/2019  

## 2020-07-09 IMAGING — CR DG ANKLE COMPLETE 3+V*L*
3 series · 3 of 3 positions shown · non-contrast
Comparison: None.

CLINICAL DATA: Left ankle pain after twisting injury 1 month ago.
Medial pain x1 month.

EXAM:
LEFT ANKLE COMPLETE - 3+ VIEW

[ankle ap]
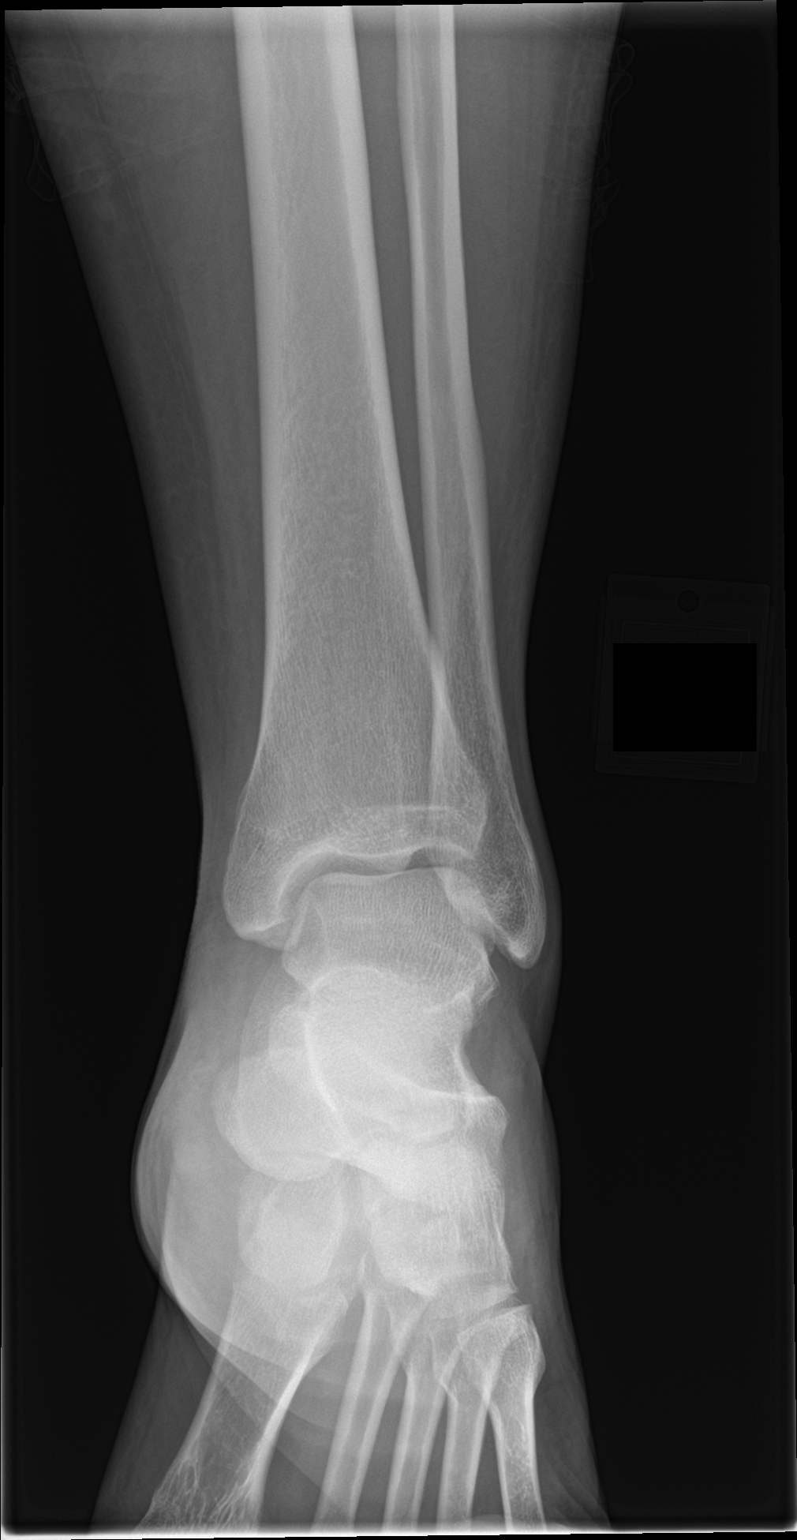

[ankle obl]
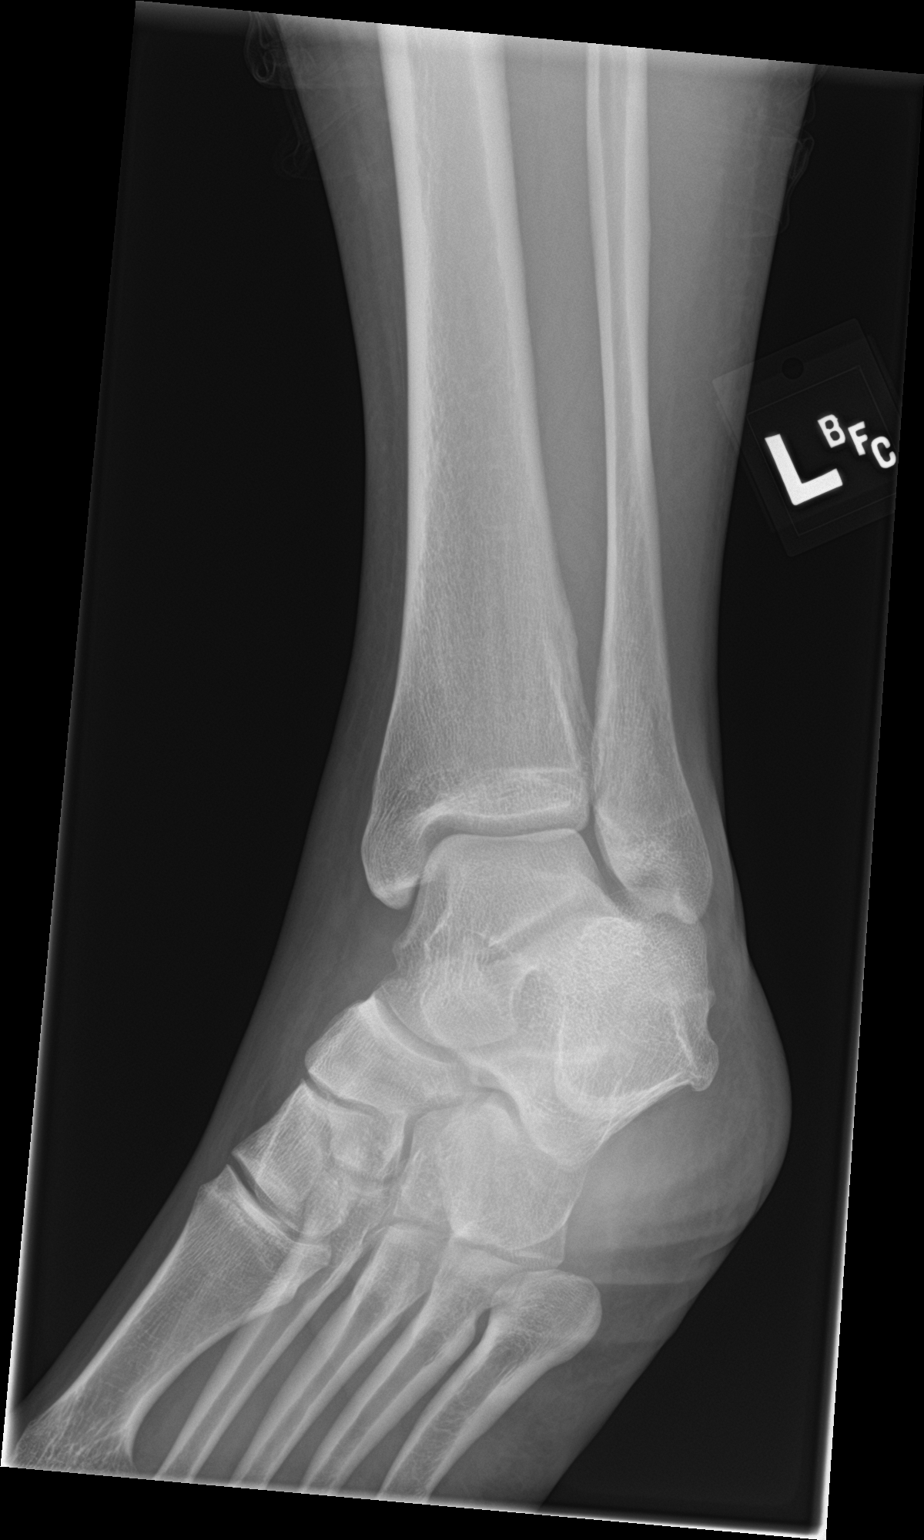

[ankle lat]
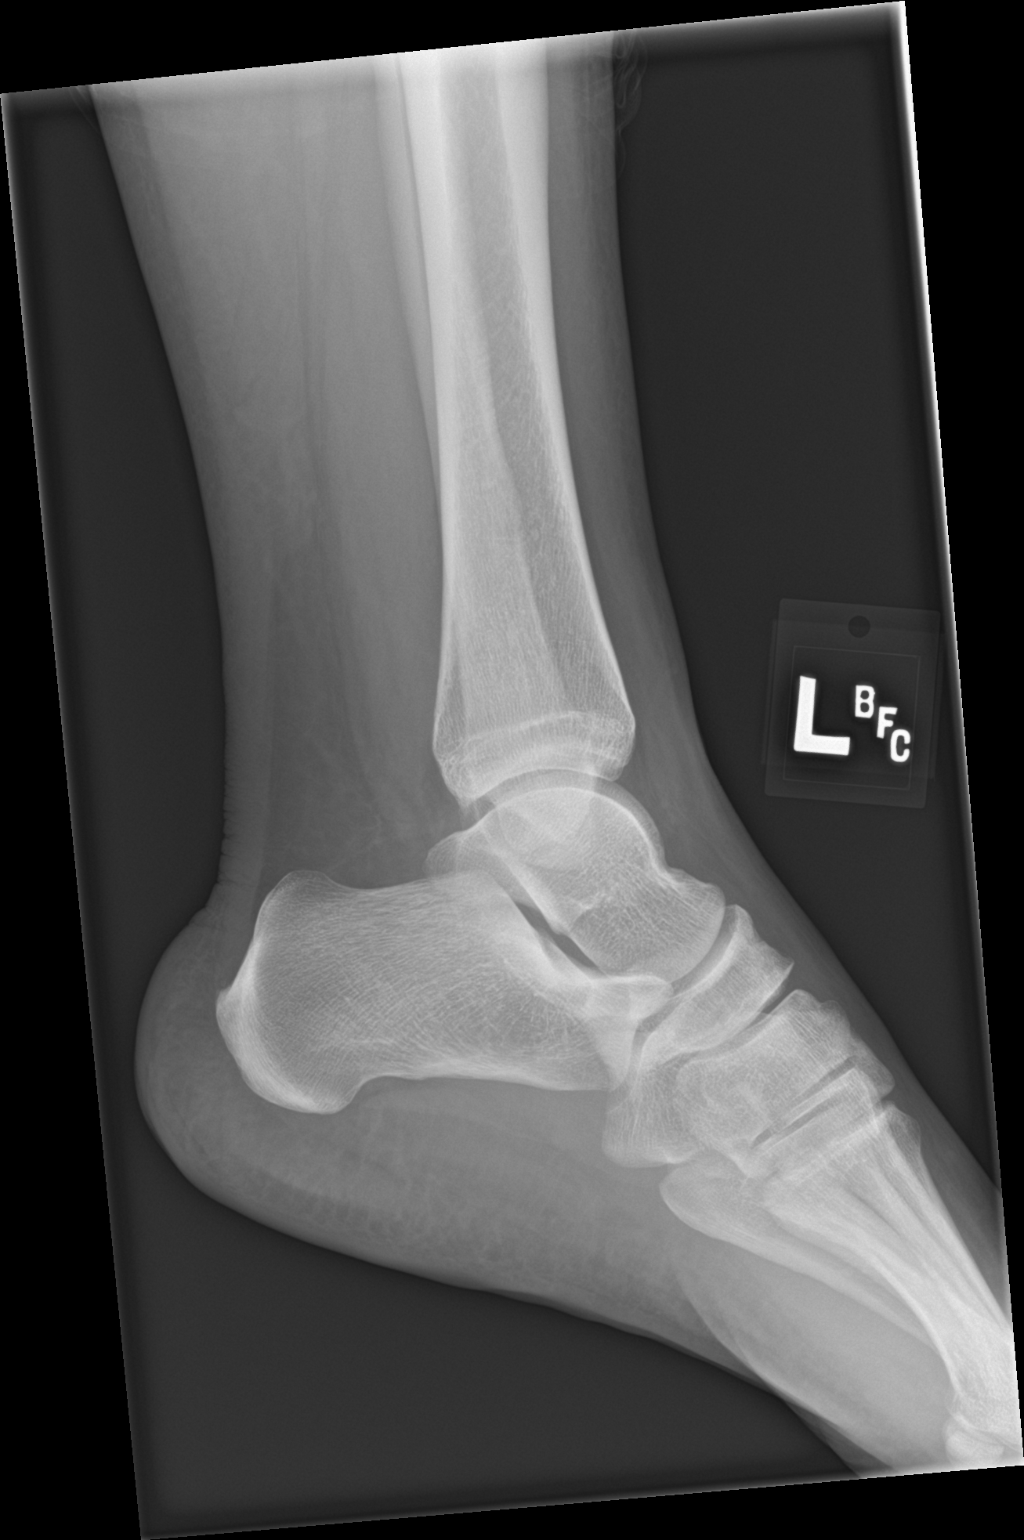

[3 of 3 positions shown; findings below may reference images not displayed]

FINDINGS: There is no evidence of fracture, dislocation, or joint effusion.
There is no evidence of arthropathy or other focal bone abnormality.
There is mild medial soft tissue swelling of the left ankle. The
ankle mortise is maintained without widening of the medial clear
space. Base of fifth metatarsal appears intact. Midfoot
articulations appear congruent.
IMPRESSION: Relative mild soft tissue swelling over the medial aspect of the
ankle joint without acute fracture, effusion or dislocations.

## 2021-02-18 ENCOUNTER — Encounter (HOSPITAL_BASED_OUTPATIENT_CLINIC_OR_DEPARTMENT_OTHER): Payer: Self-pay

## 2021-02-18 ENCOUNTER — Emergency Department (HOSPITAL_BASED_OUTPATIENT_CLINIC_OR_DEPARTMENT_OTHER)
Admission: EM | Admit: 2021-02-18 | Discharge: 2021-02-19 | Disposition: A | Discharge status: Home / Self Care | Payer: Medicaid Other | Attending: Emergency Medicine | Admitting: Emergency Medicine

## 2021-02-18 ENCOUNTER — Other Ambulatory Visit: Payer: Self-pay

## 2021-02-18 ENCOUNTER — Emergency Department (HOSPITAL_BASED_OUTPATIENT_CLINIC_OR_DEPARTMENT_OTHER): Payer: Medicaid Other | Admitting: Radiology

## 2021-02-18 DIAGNOSIS — M25531 Pain in right wrist: Secondary | ICD-10-CM | POA: Insufficient documentation

## 2021-02-18 DIAGNOSIS — S63501A Unspecified sprain of right wrist, initial encounter: Secondary | ICD-10-CM | POA: Insufficient documentation

## 2021-02-18 DIAGNOSIS — W108XXA Fall (on) (from) other stairs and steps, initial encounter: Secondary | ICD-10-CM | POA: Diagnosis not present

## 2021-02-18 DIAGNOSIS — S6991XA Unspecified injury of right wrist, hand and finger(s), initial encounter: Secondary | ICD-10-CM | POA: Diagnosis present

## 2021-02-18 NOTE — ED Triage Notes (Signed)
Patient here POV from Home with Right Hand Pain.   Patient fell approximately 1 week PTA going up the Stairs. Patient braced herself with her Right Hand. Pain at that Time began but has worsened since and transitioned from Pain to Right Thumb to Right Medial Hand.  NAD Noted during Triage. A&Ox4. GCS 15. Ambulatory.

## 2021-02-19 ENCOUNTER — Emergency Department (HOSPITAL_BASED_OUTPATIENT_CLINIC_OR_DEPARTMENT_OTHER): Payer: Medicaid Other

## 2021-02-19 MED ORDER — IBUPROFEN 800 MG PO TABS: 800.0000 mg | ORAL_TABLET | Freq: Four times a day (QID) | ORAL | 0 refills | Status: DC | PRN

## 2021-02-19 NOTE — ED Notes (Signed)
X-Ray at bedside.

## 2021-02-19 NOTE — ED Provider Notes (Signed)
[facility] EMERGENCY DEPT Provider Note   CSN: [id number] Arrival date & time: 02/18/21  2122     History  Chief Complaint  Patient presents with   Hand Pain    Amanda Heath is a 36 y.o. female.  Patient reports that she fell going up steps 1 week ago and injured her right hand and wrist area.  Patient reports progressively worsening pain since that time.      Home Medications Prior to Admission medications   Medication Sig Start Date End Date Taking? Authorizing Provider  ibuprofen (ADVIL) 800 MG tablet Take 1 tablet (800 mg total) by mouth every 6 (six) hours as needed for moderate pain. 02/19/21  Yes Cyprian Gongaware, Canary Brim, MD      Allergies    Percocet [oxycodone-acetaminophen]    Review of Systems   Review of Systems  Musculoskeletal:  Positive for arthralgias.  Neurological:  Negative for weakness and numbness.   Physical Exam Updated Vital Signs BP 107/70    Pulse 84    Temp 97.8 F (36.6 C) (Oral)    Resp 19    Ht 5\' 3"  (1.6 m)    Wt 99.8 kg    LMP 02/13/2021    SpO2 95%    BMI 38.97 kg/m  Physical Exam Vitals and nursing note reviewed.  Constitutional:      General: She is not in acute distress.    Appearance: Normal appearance. She is well-developed.  HENT:     Head: Normocephalic and atraumatic.  Eyes:     Conjunctiva/sclera: Conjunctivae normal.  Cardiovascular:     Rate and Rhythm: Normal rate and regular rhythm.     Heart sounds: No murmur heard. Pulmonary:     Effort: Pulmonary effort is normal. No respiratory distress.     Breath sounds: Normal breath sounds.  Abdominal:     Palpations: Abdomen is soft.     Tenderness: There is no abdominal tenderness.  Musculoskeletal:        General: No swelling.     Right wrist: Tenderness and snuff box tenderness present. No swelling, deformity or lacerations. Decreased range of motion. Normal pulse.     Right hand: No swelling or deformity.     Cervical back: Neck supple.     Comments:  No collateral ligamentous instability of thumb  Skin:    General: Skin is warm and dry.     Capillary Refill: Capillary refill takes less than 2 seconds.     Findings: No bruising or erythema.  Neurological:     General: No focal deficit present.     Mental Status: She is alert and oriented to person, place, and time.     Cranial Nerves: No cranial nerve deficit.     Sensory: No sensory deficit.     Motor: No weakness.  Psychiatric:        Mood and Affect: Mood normal.    ED Results / Procedures / Treatments   Labs (all labs ordered are listed, but only abnormal results are displayed) Labs Reviewed - No data to display  EKG None  Radiology DG Wrist Complete Right  Result Date: 02/19/2021 CLINICAL DATA:  Wrist pain after fall. EXAM: RIGHT WRIST - COMPLETE 3+ VIEW COMPARISON:  None. FINDINGS: There is no evidence of fracture or dislocation. Normal alignment and joint spaces. There is no evidence of arthropathy or other focal bone abnormality. Soft tissues are unremarkable. IMPRESSION: Negative radiographs of the right wrist. Electronically Signed   By: 04/19/2021  [place] M.D.   On: 02/19/2021 00:31   DG Hand Complete Right  Result Date: 02/18/2021 CLINICAL DATA:  Right hand pain after fall. EXAM: RIGHT HAND - COMPLETE 3+ VIEW COMPARISON:  None. FINDINGS: There is no evidence of fracture or dislocation. Normal alignment and joint spaces. There is no evidence of arthropathy or other focal bone abnormality. Soft tissues are unremarkable. IMPRESSION: Negative radiographs of the right hand. Electronically Signed   By: Narda Rutherford M.D.   On: 02/18/2021 21:59    Procedures Procedures    Medications Ordered in ED Medications - No data to display  ED Course/ Medical Decision Making/ A&P                           Medical Decision Making  Patient presents to the emergency department for evaluation of right thumb and wrist pain 1 week after a fall.  Patient with increasing pain.  She  does have diffuse pain over the distal metacarpals of the hand but has specific pain and tenderness of the thumb and pain with range of motion.  Additionally she does have anatomical snuffbox tenderness.  Wrist and hand x-ray are negative, but still cannot rule out scaphoid injury.  Will place in a thumb spica splint and have follow-up with hand surgery on-call.        Final Clinical Impression(s) / ED Diagnoses Final diagnoses:  Wrist sprain, right, initial encounter    Rx / DC Orders ED Discharge Orders          Ordered    ibuprofen (ADVIL) 800 MG tablet  Every 6 hours PRN        02/19/21 0059              Gilda Crease, MD 02/19/21 0100

## 2021-03-26 ENCOUNTER — Encounter: Payer: Self-pay | Admitting: Nurse Practitioner

## 2021-03-26 ENCOUNTER — Other Ambulatory Visit (HOSPITAL_COMMUNITY)
Admission: RE | Admit: 2021-03-26 | Discharge: 2021-03-26 | Disposition: A | Discharge status: Home / Self Care | Payer: Medicaid Other | Source: Ambulatory Visit | Attending: Nurse Practitioner | Admitting: Nurse Practitioner

## 2021-03-26 ENCOUNTER — Other Ambulatory Visit: Payer: Self-pay

## 2021-03-26 ENCOUNTER — Ambulatory Visit (INDEPENDENT_AMBULATORY_CARE_PROVIDER_SITE_OTHER): Payer: Medicaid Other | Admitting: Nurse Practitioner

## 2021-03-26 VITALS — BP 125/89 | HR 70 | Ht 62.0 in | Wt 222.6 lb

## 2021-03-26 DIAGNOSIS — Z6841 Body Mass Index (BMI) 40.0 and over, adult: Secondary | ICD-10-CM

## 2021-03-26 DIAGNOSIS — Z01419 Encounter for gynecological examination (general) (routine) without abnormal findings: Secondary | ICD-10-CM

## 2021-03-26 DIAGNOSIS — Z113 Encounter for screening for infections with a predominantly sexual mode of transmission: Secondary | ICD-10-CM

## 2021-03-26 NOTE — Progress Notes (Signed)
GYNECOLOGY ANNUAL PREVENTATIVE CARE ENCOUNTER NOTE  Subjective:   Amanda Heath is a 36 y.o. G61P1001 female here for a routine annual gynecologic exam.  Current complaints: recurrent BV.   Denies abnormal vaginal bleeding, discharge, pelvic pain, problems with intercourse or other gynecologic concerns.  Menses are monthly and last 4-6 days with moderate flow and sometimes cramping.  Sometimes she takes medication for cramping and other times is tolerating the cramping until it resolves.   Gynecologic History Patient's last menstrual period was 03/18/2021 (approximate). Contraception: none Last Pap: unknown.   Obstetric History OB History  Gravida Para Term Preterm AB Living  1 1 1     1   SAB IAB Ectopic Multiple Live Births          1    # Outcome Date GA Lbr Len/2nd Weight Sex Delivery Anes PTL Lv  1 Term 02/14/04    M CS-LTranv Spinal  LIV    Past Medical History:  Diagnosis Date   Anemia    blood transfusion after childbirth   Morbid obesity ([place])     Past Surgical History:  Procedure Laterality Date   CESAREAN SECTION     EXCISION OF SKIN TAG Right 10/29/2017   Procedure: EXCISION OF RIGHT POSTERIOR THIGH SKIN TAG;  Surgeon: Ralene Ok, MD;  Location: [facility];  Service: General;  Laterality: Right;    Current Outpatient Medications on File Prior to Visit  Medication Sig Dispense Refill   ibuprofen (ADVIL) 800 MG tablet Take 1 tablet (800 mg total) by mouth every 6 (six) hours as needed for moderate pain. 20 tablet 0   No current facility-administered medications on file prior to visit.    Allergies  Allergen Reactions   Percocet [Oxycodone-Acetaminophen] Shortness Of Breath    Social History   Socioeconomic History   Marital status: Single    Spouse name: Not on file   Number of children: Not on file   Years of education: Not on file   Highest education level: Not on file  Occupational History   Not on file  Tobacco Use   Smoking status: Every  Day    Packs/day: 0.25    Types: Cigarettes   Smokeless tobacco: Never  Vaping Use   Vaping Use: Never used  Substance and Sexual Activity   Alcohol use: Yes    Comment: occasional   Drug use: Yes    Types: Marijuana    Comment: daily   Sexual activity: Yes    Birth control/protection: None  Other Topics Concern   Not on file  Social History Narrative   Not on file   Social Determinants of Health   Financial Resource Strain: Not on file  Food Insecurity: Not on file  Transportation Needs: Not on file  Physical Activity: Not on file  Stress: Not on file  Social Connections: Not on file  Intimate Partner Violence: Not on file    Family History  Problem Relation Age of Onset   Healthy Mother    Healthy Father     The following portions of the patient's history were reviewed and updated as appropriate: allergies, current medications, past family history, past medical history, past social history, past surgical history and problem list.  Review of Systems Pertinent items noted in HPI and remainder of comprehensive ROS otherwise negative.   Objective:  BP 125/89    Pulse 70    Ht 5\' 2"  (1.575 m)    Wt 222 lb 9.6 oz (101  kg)    LMP 03/18/2021 (Approximate)    BMI 40.71 kg/m  CONSTITUTIONAL: Well-developed, well-nourished female in no acute distress.  HENT:  Normocephalic, atraumatic, External right and left ear normal.  EYES: Conjunctivae and EOM are normal. Pupils are equal, round.  No scleral icterus.  NECK: Normal range of motion, supple, no masses.  Normal thyroid.  SKIN: Skin is warm and dry. No rash noted. Not diaphoretic. No erythema. No pallor. NEUROLOGIC: Alert and oriented to person, place, and time. Normal reflexes, muscle tone coordination. No cranial nerve deficit noted. PSYCHIATRIC: Normal mood and affect. Normal behavior. Normal judgment and thought content. CARDIOVASCULAR: Normal heart rate noted, regular rhythm RESPIRATORY: Clear to auscultation  bilaterally. Effort and breath sounds normal, no problems with respiration noted. BREASTS: Symmetric in size. No masses, skin changes, nipple drainage, or lymphadenopathy. ABDOMEN: Soft, no distention noted.  No tenderness, rebound or guarding.  PELVIC: Normal appearing external genitalia; normal appearing vaginal mucosa and cervix.  No abnormal discharge noted.  Pap smear obtained.  Normal uterine size, no other palpable masses, no uterine or adnexal tenderness. MUSCULOSKELETAL: Normal range of motion. No tenderness.  No cyanosis, clubbing, or edema.    Assessment and Plan:  1. Encounter for annual routine gynecological examination Wants STD testing and testing for all infections Hx of recurrent BV and she has a plan for treating - has been prescribed boric acid and uses it regularly for prevention Is wondering about whether she wants to conceive Has a 6 year old child and has not gotten pregnant since that birth.  Had some illness after that birth and was very sick and wonders if she can get pregnant. Had a 7 year period previously where she was actively trying to become pregnant and did not get pregnant. Advised to make appointment with MD for further discussion if she wants to conceive. Does not douche. Reviewed using condoms as an option for avoiding BV Her previous MD has treated BV - sometimes she is seen in the office for testing and is positive and sometimes she calls in and gets medication Will test today and treat only if needed. No physical evidence of BV on exam.  Of note:  patient apologized for coming to an appointment late in the day after being at work for her pelvic exam.  Reassured that everything seems perfectly normal and there is no problem. Continue to see PCP for other medical issues.  - Cytology - PAP - Hepatitis B surface antigen - Hepatitis C antibody - RPR - HIV Antibody (routine testing w rflx) - Cervicovaginal ancillary only  2. BMI 40.0-44.9, adult  [facility])   Will follow up results of pap smear and manage accordingly. Routine preventative health maintenance measures emphasized. Please refer to After Visit Summary for other counseling recommendations.    Earlie Server, RN, MSN, NP-BC Nurse Practitioner, [place] for [facility]

## 2021-03-26 NOTE — Progress Notes (Signed)
Pt presents for annual exam and to establish care. Pt referred from PCP for recurrent BV.

## 2021-03-27 LAB — CERVICOVAGINAL ANCILLARY ONLY
Bacterial Vaginitis (gardnerella): POSITIVE — AB
Candida Glabrata: NEGATIVE
Candida Vaginitis: NEGATIVE
Chlamydia: NEGATIVE
Comment: NEGATIVE
Comment: NEGATIVE
Comment: NEGATIVE
Comment: NEGATIVE
Comment: NEGATIVE
Comment: NORMAL
Neisseria Gonorrhea: NEGATIVE
Trichomonas: NEGATIVE

## 2021-03-27 LAB — HEPATITIS B SURFACE ANTIGEN: Hepatitis B Surface Ag: NEGATIVE

## 2021-03-27 LAB — HIV ANTIBODY (ROUTINE TESTING W REFLEX): HIV Screen 4th Generation wRfx: NONREACTIVE

## 2021-03-27 LAB — RPR: RPR Ser Ql: NONREACTIVE

## 2021-03-27 LAB — HEPATITIS C ANTIBODY: Hep C Virus Ab: 0.2 s/co ratio (ref 0.0–0.9)

## 2021-03-27 MED ORDER — METRONIDAZOLE 0.75 % VA GEL: 1.0000 | Freq: Every day | VAGINAL | 0 refills | Status: AC

## 2021-03-31 ENCOUNTER — Other Ambulatory Visit: Payer: Self-pay | Admitting: Nurse Practitioner

## 2021-03-31 LAB — CYTOLOGY - PAP
Adequacy: ABSENT
Comment: NEGATIVE
Diagnosis: NEGATIVE
Diagnosis: REACTIVE
High risk HPV: NEGATIVE

## 2021-03-31 MED ORDER — METRONIDAZOLE 500 MG PO TABS: 500.0000 mg | ORAL_TABLET | Freq: Two times a day (BID) | ORAL | 0 refills | Status: DC

## 2021-03-31 NOTE — Progress Notes (Signed)
Patient requesting oral treatment for BV after vaginal cream treatment prescribed.  RX sent to her pharmacy.  Earlie Server, RN, MSN, NP-BC Nurse Practitioner, [facility] for [employer], [facility] 03/31/2021 9:29 PM

## 2021-04-09 ENCOUNTER — Other Ambulatory Visit: Payer: Self-pay | Admitting: *Deleted

## 2021-04-09 ENCOUNTER — Ambulatory Visit: Payer: Medicaid Other

## 2021-04-09 DIAGNOSIS — B3731 Acute candidiasis of vulva and vagina: Secondary | ICD-10-CM

## 2021-04-09 MED ORDER — FLUCONAZOLE 150 MG PO TABS: 150.0000 mg | ORAL_TABLET | Freq: Once | ORAL | 0 refills | Status: AC

## 2021-04-09 NOTE — Progress Notes (Signed)
TC from patient reporting recent treatment for BV. Now reports thick, white vaginal discharge. RX for Diflucan per protocol for symptoms of vaginal yeast infection. Advised patient to seek care if symptoms do not improve.

## 2021-08-28 ENCOUNTER — Ambulatory Visit (HOSPITAL_COMMUNITY)
Admission: EM | Admit: 2021-08-28 | Discharge: 2021-08-28 | Disposition: A | Discharge status: Home / Self Care | Payer: Medicaid Other | Attending: Internal Medicine | Admitting: Internal Medicine

## 2021-08-28 ENCOUNTER — Ambulatory Visit (INDEPENDENT_AMBULATORY_CARE_PROVIDER_SITE_OTHER): Payer: Medicaid Other

## 2021-08-28 ENCOUNTER — Encounter (HOSPITAL_COMMUNITY): Payer: Self-pay | Admitting: Emergency Medicine

## 2021-08-28 DIAGNOSIS — K047 Periapical abscess without sinus: Secondary | ICD-10-CM

## 2021-08-28 DIAGNOSIS — M25561 Pain in right knee: Secondary | ICD-10-CM

## 2021-08-28 DIAGNOSIS — G8929 Other chronic pain: Secondary | ICD-10-CM

## 2021-08-28 DIAGNOSIS — M25569 Pain in unspecified knee: Secondary | ICD-10-CM

## 2021-08-28 MED ORDER — AMOXICILLIN-POT CLAVULANATE 875-125 MG PO TABS: 1.0000 | ORAL_TABLET | Freq: Two times a day (BID) | ORAL | 0 refills | Status: DC

## 2021-08-28 MED ORDER — IBUPROFEN 800 MG PO TABS
800.0000 mg | ORAL_TABLET | Freq: Three times a day (TID) | ORAL | 0 refills | Status: AC | PRN
Start: 2021-08-28 — End: ?

## 2021-08-28 MED ORDER — FLUCONAZOLE 150 MG PO TABS: 150.0000 mg | ORAL_TABLET | Freq: Once | ORAL | 0 refills | Status: AC

## 2021-08-28 NOTE — Discharge Instructions (Addendum)
Please take medications as prescribed Antiseptic mouthwash as recommended Please take ibuprofen with food Please follow-up with your dentist for dental evaluation X-ray of the right knee is negative for fracture. Please follow up with orthopedic surgery If right knee pain persist please follow-up with orthopedic surgery or sports medicine for further evaluation. Return to urgent care if symptoms worsen

## 2021-08-28 NOTE — ED Triage Notes (Addendum)
Pt reports dental pain x 3 days. States the tooth that is causing the pain is in the front however pain and swelling is on the right side of the face. Noticed swelling this morning.   Also endorses a problem with right knee. States she has intermittent pain and it feels like it wants to give out. States this had been going on for awhile. Denies any obvious injuries.

## 2021-08-30 NOTE — ED Provider Notes (Signed)
[place]    CSN: [id number] Arrival date & time: 08/28/21  1045      History   Chief Complaint Chief Complaint  Patient presents with   Dental Problem   Knee Pain    HPI Amanda Heath is a 36 y.o. female comes to the urgent care with tooth ache of 3 days duration.  Pain is severe, throbbing and aggravated by chewing.  No known relieving factors.  It is associated with swelling on the right side of the face.  Patient has had dental work done on the right first upper incisor in the past.  No fever or chills.  No discharge from the gum.  Patient complains of right knee pain.  Pain has been intermittent over the past several months.  No trauma to the right.  She has not tried taking over-the-counter medications to help with right knee pain.  She endorses weight gain over the past several months.  No trauma to the knee.  No known relieving factors.  Pain resolves spontaneously.  HPI  Past Medical History:  Diagnosis Date   Anemia    blood transfusion after childbirth   Morbid obesity [facility])     Patient Active Problem List   Diagnosis Date Noted   Morbid obesity [facility])     Past Surgical History:  Procedure Laterality Date   CESAREAN SECTION     EXCISION OF SKIN TAG Right 10/29/2017   Procedure: EXCISION OF RIGHT POSTERIOR THIGH SKIN TAG;  Surgeon: Ralene Ok, MD;  Location: [place];  Service: General;  Laterality: Right;    OB History     Gravida  1   Para  1   Term  1   Preterm      AB      Living  1      SAB      IAB      Ectopic      Multiple      Live Births  1            Home Medications    Prior to Admission medications   Medication Sig Start Date End Date Taking? Authorizing Provider  amoxicillin-clavulanate (AUGMENTIN) 875-125 MG tablet Take 1 tablet by mouth every 12 (twelve) hours. 08/28/21  Yes Santhiago Collingsworth, Myrene Galas, MD  ibuprofen (ADVIL) 800 MG tablet Take 1 tablet (800 mg total) by mouth every 8 (eight) hours as needed  for moderate pain. 08/28/21   Corderro Koloski, Myrene Galas, MD    Family History Family History  Problem Relation Age of Onset   Healthy Mother    Healthy Father     Social History Social History   Tobacco Use   Smoking status: Every Day    Packs/day: 0.25    Types: Cigarettes   Smokeless tobacco: Never  Vaping Use   Vaping Use: Never used  Substance Use Topics   Alcohol use: Yes    Comment: occasional   Drug use: Yes    Types: Marijuana    Comment: daily     Allergies   Percocet [oxycodone-acetaminophen]   Review of Systems Review of Systems  HENT:  Positive for dental problem and facial swelling.   Respiratory: Negative.    Gastrointestinal: Negative.   Musculoskeletal:  Positive for arthralgias and joint swelling.     Physical Exam Triage Vital Signs ED Triage Vitals  Enc Vitals Group     BP 08/28/21 1213 127/84     Pulse Rate 08/28/21 1213 60  Resp 08/28/21 1213 18     Temp 08/28/21 1213 98.2 F (36.8 C)     Temp Source 08/28/21 1213 Oral     SpO2 08/28/21 1213 98 %     Weight --      Height --      Head Circumference --      Peak Flow --      Pain Score 08/28/21 1209 10     Pain Loc --      Pain Edu? --      Excl. in GC? --    No data found.  Updated Vital Signs BP 127/84 (BP Location: Right Arm)   Pulse 60   Temp 98.2 F (36.8 C) (Oral)   Resp 18   SpO2 98%   Visual Acuity Right Eye Distance:   Left Eye Distance:   Bilateral Distance:    Right Eye Near:   Left Eye Near:    Bilateral Near:     Physical Exam Vitals and nursing note reviewed.  Constitutional:      General: She is not in acute distress.    Appearance: She is not ill-appearing.  HENT:     Nose: Nose normal.     Mouth/Throat:     Mouth: Mucous membranes are moist.     Comments: Tenderness on palpating the gum around the first right upper incisor Cardiovascular:     Rate and Rhythm: Normal rate and regular rhythm.  Musculoskeletal:        General: Tenderness present.  No swelling. Normal range of motion.     Comments: Mild tenderness over the medial collateral ligament of the right knee.  Full range of motion exercises.  Anterior and posterior drawer tests are negative.  Neurological:     Mental Status: She is alert.      UC Treatments / Results  Labs (all labs ordered are listed, but only abnormal results are displayed) Labs Reviewed - No data to display  EKG   Radiology DG Knee Complete 4 Views Right  Result Date: 08/28/2021 CLINICAL DATA:  Chronic pain x5 months EXAM: RIGHT KNEE - COMPLETE 4+ VIEW COMPARISON:  None Available. FINDINGS: No recent fracture or dislocation is seen. There is no significant effusion in suprapatellar bursa. Small smoothly marginated calcifications are noted at the attachment of infrapatellar tendon to the proximal tibia, possibly old avulsions or calcific tendinosis. There is deformity in the proximal shaft of right fibula without break in the cortical margins. This may suggest old healed fracture. Less likely possibility would be chronic inflammatory process. IMPRESSION: No recent fracture or dislocation is seen right knee. Small smoothly marginated calcifications adjacent to the anterior margin of proximal tibia may be the sequela from previous injury. There is deformity in the proximal shaft of right fibula without break in the cortical margins, possibly old healed fracture. Less likely possibility would be chronic inflammatory process. If there are continued symptoms, follow-up radiographic examination in 3 months may be considered to assess stability. Electronically Signed   By: Ernie Avena M.D.   On: 08/28/2021 12:46    Procedures Procedures (including critical care time)  Medications Ordered in UC Medications - No data to display  Initial Impression / Assessment and Plan / UC Course  I have reviewed the triage vital signs and the nursing notes.  Pertinent labs & imaging results that were available during  my care of the patient were reviewed by me and considered in my medical decision making (see chart for details).  1.  Dental infection: Augmentin twice daily for 7 days Ibuprofen as needed for pain/or fever Patient advised to follow-up with a dentist for further evaluation  2.  Right knee pain likely osteoarthritis: X-ray of the right knee is negative for fracture or dislocation.  X-ray reveals possibly old healed fracture of the right fibula. Patient is advised to follow-up with orthopedic surgery in a couple of weeks.  Patient will follow up with [facility]. Right knee brace Return precautions given. Final Clinical Impressions(s) / UC Diagnoses   Final diagnoses:  Dental infection  Knee pain, unspecified chronicity, unspecified laterality     Discharge Instructions      Please take medications as prescribed Antiseptic mouthwash as recommended Please take ibuprofen with food Please follow-up with your dentist for dental evaluation X-ray of the right knee is negative for fracture. Please follow up with orthopedic surgery If right knee pain persist please follow-up with orthopedic surgery or sports medicine for further evaluation. Return to urgent care if symptoms worsen    ED Prescriptions     Medication Sig Dispense Auth. Provider   ibuprofen (ADVIL) 800 MG tablet Take 1 tablet (800 mg total) by mouth every 8 (eight) hours as needed for moderate pain. 30 tablet Claud Gowan, Britta Mccreedy, MD   amoxicillin-clavulanate (AUGMENTIN) 875-125 MG tablet Take 1 tablet by mouth every 12 (twelve) hours. 14 tablet Trystyn Dolley, Britta Mccreedy, MD   fluconazole (DIFLUCAN) 150 MG tablet Take 1 tablet (150 mg total) by mouth once for 1 dose. These take the second tablet 3 days after the first tablet. 2 tablet Blair Mesina, Britta Mccreedy, MD      PDMP not reviewed this encounter.   Merrilee Jansky, MD 08/30/21 1041

## 2021-11-26 ENCOUNTER — Telehealth: Payer: Medicaid Other | Admitting: Physician Assistant

## 2021-11-26 DIAGNOSIS — B9689 Other specified bacterial agents as the cause of diseases classified elsewhere: Secondary | ICD-10-CM

## 2021-11-26 DIAGNOSIS — N760 Acute vaginitis: Secondary | ICD-10-CM

## 2021-11-26 MED ORDER — METRONIDAZOLE 500 MG PO TABS: 500.0000 mg | ORAL_TABLET | Freq: Two times a day (BID) | ORAL | 0 refills | Status: AC

## 2021-11-26 MED ORDER — FLUCONAZOLE 150 MG PO TABS: 150.0000 mg | ORAL_TABLET | Freq: Once | ORAL | 0 refills | Status: AC

## 2021-11-26 NOTE — Progress Notes (Signed)
E-Visit for Vaginal Symptoms  We are sorry that you are not feeling well. Here is how we plan to help! Based on what you shared with me it looks like you: May have a vaginosis due to bacteria  Vaginosis is an inflammation of the vagina that can result in discharge, itching and pain. The cause is usually a change in the normal balance of vaginal bacteria or an infection. Vaginosis can also result from reduced estrogen levels after menopause.  The most common causes of vaginosis are:   Bacterial vaginosis which results from an overgrowth of one on several organisms that are normally present in your vagina.   Yeast infections which are caused by a naturally occurring fungus called candida.   Vaginal atrophy (atrophic vaginosis) which results from the thinning of the vagina from reduced estrogen levels after menopause.   Trichomoniasis which is caused by a parasite and is commonly transmitted by sexual intercourse.  Factors that increase your risk of developing vaginosis include: Medications, such as antibiotics and steroids Uncontrolled diabetes Use of hygiene products such as bubble bath, vaginal spray or vaginal deodorant Douching Wearing damp or tight-fitting clothing Using an intrauterine device (IUD) for birth control Hormonal changes, such as those associated with pregnancy, birth control pills or menopause Sexual activity Having a sexually transmitted infection  Your treatment plan is Metronidazole or Flagyl 500mg twice a day for 7 days.  I have electronically sent this prescription into the pharmacy that you have chosen.  Be sure to take all of the medication as directed. Stop taking any medication if you develop a rash, tongue swelling or shortness of breath. Mothers who are breast feeding should consider pumping and discarding their breast milk while on these antibiotics. However, there is no consensus that infant exposure at these doses would be harmful.  Remember that  medication creams can weaken latex condoms. .   HOME CARE:  Good hygiene may prevent some types of vaginosis from recurring and may relieve some symptoms:  Avoid baths, hot tubs and whirlpool spas. Rinse soap from your outer genital area after a shower, and dry the area well to prevent irritation. Don't use scented or harsh soaps, such as those with deodorant or antibacterial action. Avoid irritants. These include scented tampons and pads. Wipe from front to back after using the toilet. Doing so avoids spreading fecal bacteria to your vagina.  Other things that may help prevent vaginosis include:  Don't douche. Your vagina doesn't require cleansing other than normal bathing. Repetitive douching disrupts the normal organisms that reside in the vagina and can actually increase your risk of vaginal infection. Douching won't clear up a vaginal infection. Use a latex condom. Both female and female latex condoms may help you avoid infections spread by sexual contact. Wear cotton underwear. Also wear pantyhose with a cotton crotch. If you feel comfortable without it, skip wearing underwear to bed. Yeast thrives in moist environments Your symptoms should improve in the next day or two.  GET HELP RIGHT AWAY IF:  You have pain in your lower abdomen ( pelvic area or over your ovaries) You develop nausea or vomiting You develop a fever Your discharge changes or worsens You have persistent pain with intercourse You develop shortness of breath, a rapid pulse, or you faint.  These symptoms could be signs of problems or infections that need to be evaluated by a medical provider now.  MAKE SURE YOU   Understand these instructions. Will watch your condition. Will get help right   away if you are not doing well or get worse.  Thank you for choosing an e-visit.  Your e-visit answers were reviewed by a board certified advanced clinical practitioner to complete your personal care plan. Depending upon the  condition, your plan could have included both over the counter or prescription medications.  Please review your pharmacy choice. Make sure the pharmacy is open so you can pick up prescription now. If there is a problem, you may contact your provider through MyChart messaging and have the prescription routed to another pharmacy.  Your safety is important to us. If you have drug allergies check your prescription carefully.   For the next 24 hours you can use MyChart to ask questions about today's visit, request a non-urgent call back, or ask for a work or school excuse. You will get an email in the next two days asking about your experience. I hope that your e-visit has been valuable and will speed your recovery.  I have spent 5 minutes in review of e-visit questionnaire, review and updating patient chart, medical decision making and response to patient.   Yalitza Teed M Venisa Frampton, PA-C  

## 2021-11-26 NOTE — Addendum Note (Signed)
Addended by: Mar Daring on: 11/26/2021 12:00 PM   Modules accepted: Orders

## 2023-01-19 ENCOUNTER — Encounter: Payer: Self-pay | Admitting: Obstetrics and Gynecology

## 2023-01-19 ENCOUNTER — Other Ambulatory Visit (HOSPITAL_COMMUNITY)
Admission: RE | Admit: 2023-01-19 | Discharge: 2023-01-19 | Disposition: A | Discharge status: Home / Self Care | Payer: 59 | Source: Ambulatory Visit | Attending: Obstetrics and Gynecology | Admitting: Obstetrics and Gynecology

## 2023-01-19 ENCOUNTER — Ambulatory Visit (INDEPENDENT_AMBULATORY_CARE_PROVIDER_SITE_OTHER): Payer: 59 | Admitting: Obstetrics and Gynecology

## 2023-01-19 VITALS — BP 101/69 | HR 71 | Ht 63.0 in | Wt 222.0 lb

## 2023-01-19 DIAGNOSIS — Z113 Encounter for screening for infections with a predominantly sexual mode of transmission: Secondary | ICD-10-CM | POA: Insufficient documentation

## 2023-01-19 DIAGNOSIS — N898 Other specified noninflammatory disorders of vagina: Secondary | ICD-10-CM | POA: Diagnosis not present

## 2023-01-19 DIAGNOSIS — Z01419 Encounter for gynecological examination (general) (routine) without abnormal findings: Secondary | ICD-10-CM

## 2023-01-19 DIAGNOSIS — B9689 Other specified bacterial agents as the cause of diseases classified elsewhere: Secondary | ICD-10-CM | POA: Insufficient documentation

## 2023-01-19 DIAGNOSIS — N760 Acute vaginitis: Secondary | ICD-10-CM | POA: Diagnosis not present

## 2023-01-19 DIAGNOSIS — Z1339 Encounter for screening examination for other mental health and behavioral disorders: Secondary | ICD-10-CM

## 2023-01-19 MED ORDER — FLUCONAZOLE 150 MG PO TABS: 150.0000 mg | ORAL_TABLET | Freq: Once | ORAL | 0 refills | Status: AC

## 2023-01-19 MED ORDER — METRONIDAZOLE 500 MG PO TABS: 500.0000 mg | ORAL_TABLET | Freq: Two times a day (BID) | ORAL | 0 refills | Status: AC

## 2023-01-19 NOTE — Progress Notes (Signed)
GYNECOLOGY ANNUAL PREVENTATIVE CARE ENCOUNTER NOTE  History:     Amanda Heath is a 37 y.o. G39P1001 female here for a routine annual gynecologic exam.  Current complaints: none.   Denies abnormal vaginal bleeding, discharge, pelvic pain, problems with intercourse or other gynecologic concerns.   Pt noted occasional missed menses for 2-3 months.  This usually happens once per year.  Menses last 4-5 days with normal flow.  Notes occasional pink discharge after wiping in the rest room.  No actual blood in the urine.   Gynecologic History Patient's last menstrual period was 12/29/2022 (exact date). Contraception: none Last Pap: 03/26/21. Results were: normal with negative HPV Last mammogram:n/a  Obstetric History OB History  Gravida Para Term Preterm AB Living  1 1 1     1   SAB IAB Ectopic Multiple Live Births          1    # Outcome Date GA Lbr Len/2nd Weight Sex Type Anes PTL Lv  1 Term 02/14/04    M CS-LTranv Spinal  LIV    Past Medical History:  Diagnosis Date   Anemia    blood transfusion after childbirth   Morbid obesity (HCC)     Past Surgical History:  Procedure Laterality Date   CESAREAN SECTION     EXCISION OF SKIN TAG Right 10/29/2017   Procedure: EXCISION OF RIGHT POSTERIOR THIGH SKIN TAG;  Surgeon: Axel Filler, MD;  Location[facility];  Service: General;  Laterality: Right;    Current Outpatient Medications on File Prior to Visit  Medication Sig Dispense Refill   ibuprofen (ADVIL) 800 MG tablet Take 1 tablet (800 mg total) by mouth every 8 (eight) hours as needed for moderate pain. (Patient not taking: Reported on 01/19/2023) 30 tablet 0   No current facility-administered medications on file prior to visit.    Allergies  Allergen Reactions   Percocet [Oxycodone-Acetaminophen] Shortness Of Breath    Social History:  reports that she has been smoking cigarettes. She has never used smokeless tobacco. She reports current alcohol use. She reports current  drug use. Drug: Marijuana.  Family History  Problem Relation Age of Onset   Healthy Mother    Healthy Father     The following portions of the patient's history were reviewed and updated as appropriate: allergies, current medications, past family history, past medical history, past social history, past surgical history and problem list.  Review of Systems Pertinent items noted in HPI and remainder of comprehensive ROS otherwise negative.  Physical Exam:  BP 101/69   Pulse 71   Ht 5\' 3"  (1.6 m)   Wt 222 lb (100.7 kg)   LMP 12/29/2022 (Exact Date)   BMI 39.33 kg/m  CONSTITUTIONAL: Well-developed, well-nourished female in no acute distress.  HENT:  Normocephalic, atraumatic, External right and left ear normal. Oropharynx is clear and moist EYES: Conjunctivae and EOM are normal.  NECK: Normal range of motion, supple, no masses.  Normal thyroid.  SKIN: Skin is warm and dry. No rash noted. Not diaphoretic. No erythema. No pallor. MUSCULOSKELETAL: Normal range of motion. No tenderness.  No cyanosis, clubbing, or edema.  2+ distal pulses. NEUROLOGIC: Alert and oriented to person, place, and time. Normal reflexes, muscle tone coordination.  PSYCHIATRIC: Normal mood and affect. Normal behavior. Normal judgment and thought content. CARDIOVASCULAR: Normal heart rate noted, regular rhythm RESPIRATORY: Clear to auscultation bilaterally. Effort and breath sounds normal, no problems with respiration noted. BREASTS: Symmetric in size. No masses, tenderness, skin  changes, nipple drainage, or lymphadenopathy bilaterally. Performed in the presence of a chaperone. ABDOMEN: Soft, no distention noted.  No tenderness, rebound or guarding. Old pfannenstiel scar noted and another scar just  above it 3-4 cm.  Not sure where it came from PELVIC: Normal appearing external genitalia and urethral meatus; normal appearing vaginal mucosa and cervix.  Slightly malodorous discharge noted.  Pap smear obtained. Vagina  swab obtained. Normal uterine size, no other palpable masses, no uterine or adnexal tenderness.  Performed in the presence of a chaperone.   Assessment and Plan:    1. Women's annual routine gynecological examination [Z01.419] Normal annual exam - Cytology - PAP( [place]) - Urine Culture  2. Routine screening for STI (sexually transmitted infection) Per pt request  - Cervicovaginal ancillary only( [place]) - HIV antibody (with reflex) - Hepatitis C Antibody - Hepatitis B Surface AntiGEN - [facility]  3. Vaginal discharge [place] [facility], pt desired empirical treatment today.  - metroNIDAZOLE (FLAGYL) 500 MG tablet; Take 1 tablet (500 mg total) by mouth 2 (two) times daily for 7 days.  Dispense: 14 tablet; Refill: 0 - fluconazole (DIFLUCAN) 150 MG tablet; Take 1 tablet (150 mg total) by mouth once for 1 dose. Use for post antibiotic treatment yeast infection  Dispense: 1 tablet; Refill: 0  Will follow up results of pap smear and manage accordingly. Routine preventative health maintenance measures emphasized. Please refer to After Visit Summary for other counseling recommendations.      Mariel Aloe, MD, FACOG Obstetrician & Gynecologist, [facility], [facility]

## 2023-01-19 NOTE — Progress Notes (Signed)
37 y.o. GYN presents for AEX/PAP/STD screening.  C/o spotting when she voids, she had blood in her urine 10 months ago.  Pt denies urinary issues.

## 2023-01-20 LAB — CERVICOVAGINAL ANCILLARY ONLY
Bacterial Vaginitis (gardnerella): POSITIVE — AB
Candida Glabrata: NEGATIVE
Candida Vaginitis: NEGATIVE
Chlamydia: NEGATIVE
Comment: NEGATIVE
Comment: NEGATIVE
Comment: NEGATIVE
Comment: NEGATIVE
Comment: NEGATIVE
Comment: NORMAL
Neisseria Gonorrhea: NEGATIVE
Trichomonas: NEGATIVE

## 2023-01-20 LAB — HIV ANTIBODY (ROUTINE TESTING W REFLEX): HIV Screen 4th Generation wRfx: NONREACTIVE

## 2023-01-20 LAB — HEPATITIS C ANTIBODY: Hep C Virus Ab: NONREACTIVE

## 2023-01-20 LAB — HEPATITIS B SURFACE ANTIGEN: Hepatitis B Surface Ag: NEGATIVE

## 2023-01-20 LAB — RPR: RPR Ser Ql: NONREACTIVE

## 2023-01-21 LAB — CYTOLOGY - PAP
Comment: NEGATIVE
Diagnosis: NEGATIVE
High risk HPV: NEGATIVE

## 2023-01-21 LAB — URINE CULTURE: Organism ID, Bacteria: NO GROWTH
